# Patient Record
Sex: Male | Born: 1955
Health system: Southern US, Community
[De-identification: ages and names within clinical notes are randomized; demographics above are authoritative.]

## PROBLEM LIST (undated history)

## (undated) DIAGNOSIS — I2510 Atherosclerotic heart disease of native coronary artery without angina pectoris: Secondary | ICD-10-CM

## (undated) DIAGNOSIS — I10 Essential (primary) hypertension: Secondary | ICD-10-CM

## (undated) DIAGNOSIS — M170 Bilateral primary osteoarthritis of knee: Secondary | ICD-10-CM

## (undated) DIAGNOSIS — R739 Hyperglycemia, unspecified: Secondary | ICD-10-CM

## (undated) DIAGNOSIS — E785 Hyperlipidemia, unspecified: Secondary | ICD-10-CM

## (undated) DIAGNOSIS — R7303 Prediabetes: Secondary | ICD-10-CM

## (undated) DIAGNOSIS — Z789 Other specified health status: Secondary | ICD-10-CM

## (undated) DIAGNOSIS — N429 Disorder of prostate, unspecified: Secondary | ICD-10-CM

## (undated) DIAGNOSIS — K921 Melena: Secondary | ICD-10-CM

## (undated) HISTORY — DX: Essential (primary) hypertension: I10

## (undated) HISTORY — DX: Hyperlipidemia, unspecified: E78.5

## (undated) HISTORY — DX: Bilateral primary osteoarthritis of knee: M17.0

## (undated) HISTORY — DX: Atherosclerotic heart disease of native coronary artery without angina pectoris: I25.10

## (undated) HISTORY — DX: Other specified health status: Z78.9

## (undated) HISTORY — DX: Melena: K92.1

## (undated) HISTORY — DX: Prediabetes: R73.03

## (undated) HISTORY — DX: Hyperglycemia, unspecified: R73.9

## (undated) HISTORY — PX: EYE MUSCLE SURGERY: SHX370

## (undated) HISTORY — DX: Disorder of prostate, unspecified: N42.9

---

## 2000-12-28 HISTORY — PX: CORONARY STENT PLACEMENT: SHX1402

## 2001-10-06 ENCOUNTER — Inpatient Hospital Stay (HOSPITAL_COMMUNITY): Admission: AD | Admit: 2001-10-06 | Discharge: 2001-10-08 | Payer: Self-pay | Admitting: Internal Medicine

## 2001-10-07 ENCOUNTER — Encounter: Payer: Self-pay | Admitting: Internal Medicine

## 2005-10-29 ENCOUNTER — Ambulatory Visit: Payer: Self-pay | Admitting: Cardiology

## 2005-11-06 ENCOUNTER — Ambulatory Visit: Payer: Self-pay | Admitting: Cardiology

## 2007-01-03 ENCOUNTER — Ambulatory Visit: Payer: Self-pay | Admitting: Cardiovascular Disease

## 2007-01-03 LAB — CONVERTED CEMR LAB
ALT: 48 units/L — ABNORMAL HIGH (ref 0–40)
AST: 29 units/L (ref 0–37)
Albumin: 4.1 g/dL (ref 3.5–5.2)
Alkaline Phosphatase: 65 units/L (ref 39–117)
BUN: 18 mg/dL (ref 6–23)
CO2: 28 meq/L (ref 19–32)
Calcium: 9 mg/dL (ref 8.4–10.5)
Chloride: 108 meq/L (ref 96–112)
Chol/HDL Ratio, serum: 3.5
Cholesterol: 123 mg/dL (ref 0–200)
Creatinine, Ser: 1 mg/dL (ref 0.4–1.5)
GFR calc non Af Amer: 84 mL/min
Glomerular Filtration Rate, Af Am: 101 mL/min/{1.73_m2}
Glucose, Bld: 104 mg/dL — ABNORMAL HIGH (ref 70–99)
HDL: 34.9 mg/dL — ABNORMAL LOW (ref >39.0)
LDL Cholesterol: 72 mg/dL (ref 0–99)
Potassium: 4 meq/L (ref 3.5–5.1)
Sodium: 143 meq/L (ref 135–145)
Total Bilirubin: 0.8 mg/dL (ref 0.3–1.2)
Total Protein: 6.4 g/dL (ref 6.0–8.3)
Triglyceride fasting, serum: 79 mg/dL (ref 0–149)
VLDL: 16 mg/dL (ref 0–40)

## 2007-01-06 ENCOUNTER — Ambulatory Visit: Payer: Self-pay | Admitting: Cardiovascular Disease

## 2007-01-24 ENCOUNTER — Ambulatory Visit: Payer: Self-pay | Admitting: Cardiovascular Disease

## 2007-01-24 ENCOUNTER — Ambulatory Visit: Payer: Self-pay

## 2007-01-24 LAB — CONVERTED CEMR LAB
BUN: 18 mg/dL (ref 6–23)
CO2: 29 meq/L (ref 19–32)
Calcium: 9.5 mg/dL (ref 8.4–10.5)
Chloride: 105 meq/L (ref 96–112)
Creatinine, Ser: 1.1 mg/dL (ref 0.4–1.5)
GFR calc Af Amer: 90 mL/min
GFR calc non Af Amer: 75 mL/min
Glucose, Bld: 98 mg/dL (ref 70–99)
Potassium: 4.2 meq/L (ref 3.5–5.1)
Sodium: 139 meq/L (ref 135–145)

## 2007-02-16 ENCOUNTER — Ambulatory Visit: Payer: Self-pay | Admitting: Cardiovascular Disease

## 2007-03-08 ENCOUNTER — Ambulatory Visit: Payer: Self-pay | Admitting: Internal Medicine

## 2007-04-15 ENCOUNTER — Ambulatory Visit: Payer: Self-pay | Admitting: Internal Medicine

## 2007-10-07 DIAGNOSIS — I10 Essential (primary) hypertension: Secondary | ICD-10-CM

## 2007-10-07 HISTORY — DX: Essential (primary) hypertension: I10

## 2007-10-08 DIAGNOSIS — E785 Hyperlipidemia, unspecified: Secondary | ICD-10-CM

## 2007-10-08 DIAGNOSIS — I2510 Atherosclerotic heart disease of native coronary artery without angina pectoris: Secondary | ICD-10-CM | POA: Insufficient documentation

## 2007-10-08 HISTORY — DX: Hyperlipidemia, unspecified: E78.5

## 2007-10-08 HISTORY — DX: Atherosclerotic heart disease of native coronary artery without angina pectoris: I25.10

## 2007-10-10 ENCOUNTER — Ambulatory Visit: Payer: Self-pay | Admitting: Internal Medicine

## 2007-11-14 ENCOUNTER — Ambulatory Visit: Payer: Self-pay | Admitting: Internal Medicine

## 2008-01-10 ENCOUNTER — Ambulatory Visit: Payer: Self-pay | Admitting: Internal Medicine

## 2008-01-10 LAB — CONVERTED CEMR LAB
ALT: 38 units/L (ref 0–53)
AST: 21 units/L (ref 0–37)
Albumin: 4 g/dL (ref 3.5–5.2)
Alkaline Phosphatase: 56 units/L (ref 39–117)
BUN: 20 mg/dL (ref 6–23)
Bilirubin, Direct: 0.2 mg/dL (ref 0.0–0.3)
CO2: 31 meq/L (ref 19–32)
Calcium: 9.5 mg/dL (ref 8.4–10.5)
Chloride: 107 meq/L (ref 96–112)
Cholesterol: 126 mg/dL (ref 0–200)
Creatinine, Ser: 0.9 mg/dL (ref 0.4–1.5)
GFR calc Af Amer: 114 mL/min
GFR calc non Af Amer: 94 mL/min
Glucose, Bld: 108 mg/dL — ABNORMAL HIGH (ref 70–99)
HDL: 32.7 mg/dL — ABNORMAL LOW (ref >39.0)
LDL Cholesterol: 80 mg/dL (ref 0–99)
PSA: 0.25 ng/mL (ref 0.10–4.00)
Potassium: 4.2 meq/L (ref 3.5–5.1)
Sodium: 144 meq/L (ref 135–145)
Total Bilirubin: 1.1 mg/dL (ref 0.3–1.2)
Total CHOL/HDL Ratio: 3.9
Total Protein: 6.8 g/dL (ref 6.0–8.3)
Triglycerides: 68 mg/dL (ref 0–149)
VLDL: 14 mg/dL (ref 0–40)

## 2008-01-16 ENCOUNTER — Ambulatory Visit: Payer: Self-pay | Admitting: Internal Medicine

## 2008-07-16 ENCOUNTER — Ambulatory Visit: Payer: Self-pay | Admitting: Internal Medicine

## 2008-07-16 DIAGNOSIS — M159 Polyosteoarthritis, unspecified: Secondary | ICD-10-CM | POA: Insufficient documentation

## 2008-07-16 DIAGNOSIS — M170 Bilateral primary osteoarthritis of knee: Secondary | ICD-10-CM

## 2008-07-16 HISTORY — DX: Bilateral primary osteoarthritis of knee: M17.0

## 2008-10-03 ENCOUNTER — Telehealth: Payer: Self-pay | Admitting: Internal Medicine

## 2008-12-24 ENCOUNTER — Telehealth: Payer: Self-pay | Admitting: Internal Medicine

## 2009-01-08 ENCOUNTER — Ambulatory Visit: Payer: Self-pay | Admitting: Internal Medicine

## 2009-01-08 LAB — CONVERTED CEMR LAB
ALT: 45 units/L (ref 0–53)
AST: 24 units/L (ref 0–37)
BUN: 20 mg/dL (ref 6–23)
CO2: 28 meq/L (ref 19–32)
Calcium: 9.3 mg/dL (ref 8.4–10.5)
Chloride: 111 meq/L (ref 96–112)
Cholesterol: 170 mg/dL (ref 0–200)
Creatinine, Ser: 1 mg/dL (ref 0.4–1.5)
GFR calc Af Amer: 101 mL/min
GFR calc non Af Amer: 83 mL/min
Glucose, Bld: 106 mg/dL — ABNORMAL HIGH (ref 70–99)
HDL: 28.3 mg/dL — ABNORMAL LOW (ref >39.0)
LDL Cholesterol: 116 mg/dL — ABNORMAL HIGH (ref 0–99)
PSA: 0.3 ng/mL (ref 0.10–4.00)
Potassium: 4.3 meq/L (ref 3.5–5.1)
Sodium: 144 meq/L (ref 135–145)
Total CHOL/HDL Ratio: 6
Triglycerides: 130 mg/dL (ref 0–149)
VLDL: 26 mg/dL (ref 0–40)

## 2009-01-14 ENCOUNTER — Ambulatory Visit: Payer: Self-pay | Admitting: Internal Medicine

## 2009-09-03 ENCOUNTER — Ambulatory Visit: Payer: Self-pay | Admitting: Internal Medicine

## 2009-09-03 LAB — CONVERTED CEMR LAB
ALT: 40 units/L (ref 0–53)
AST: 21 units/L (ref 0–37)
Cholesterol: 103 mg/dL (ref 0–200)
HDL: 31.2 mg/dL — ABNORMAL LOW (ref >39.00)
LDL Cholesterol: 57 mg/dL (ref 0–99)
Total CHOL/HDL Ratio: 3
Triglycerides: 72 mg/dL (ref 0.0–149.0)
VLDL: 14.4 mg/dL (ref 0.0–40.0)

## 2009-09-06 ENCOUNTER — Ambulatory Visit: Payer: Self-pay | Admitting: Internal Medicine

## 2009-12-28 HISTORY — PX: COLONOSCOPY: SHX174

## 2010-01-13 ENCOUNTER — Telehealth: Payer: Self-pay | Admitting: Internal Medicine

## 2010-03-11 ENCOUNTER — Telehealth: Payer: Self-pay | Admitting: Internal Medicine

## 2010-03-12 ENCOUNTER — Ambulatory Visit: Payer: Self-pay | Admitting: Internal Medicine

## 2010-03-12 LAB — CONVERTED CEMR LAB
BUN: 21 mg/dL (ref 6–23)
CO2: 29 meq/L (ref 19–32)
Calcium: 8.9 mg/dL (ref 8.4–10.5)
Chloride: 107 meq/L (ref 96–112)
Creatinine, Ser: 0.9 mg/dL (ref 0.4–1.5)
GFR calc non Af Amer: 93.07 mL/min (ref >60)
Glucose, Bld: 129 mg/dL — ABNORMAL HIGH (ref 70–99)
Potassium: 3.4 meq/L — ABNORMAL LOW (ref 3.5–5.1)
Sodium: 141 meq/L (ref 135–145)

## 2010-03-20 ENCOUNTER — Encounter: Payer: Self-pay | Admitting: Internal Medicine

## 2010-03-20 DIAGNOSIS — R739 Hyperglycemia, unspecified: Secondary | ICD-10-CM | POA: Insufficient documentation

## 2010-03-20 HISTORY — DX: Hyperglycemia, unspecified: R73.9

## 2010-04-14 ENCOUNTER — Telehealth: Payer: Self-pay | Admitting: Internal Medicine

## 2010-04-15 ENCOUNTER — Ambulatory Visit: Payer: Self-pay | Admitting: Internal Medicine

## 2010-04-28 ENCOUNTER — Telehealth: Payer: Self-pay | Admitting: Internal Medicine

## 2010-06-13 ENCOUNTER — Encounter: Payer: Self-pay | Admitting: Internal Medicine

## 2010-06-13 LAB — CONVERTED CEMR LAB
BUN: 27 mg/dL — ABNORMAL HIGH (ref 6–23)
CO2: 27 meq/L (ref 19–32)
Calcium: 9.5 mg/dL (ref 8.4–10.5)
Chloride: 104 meq/L (ref 96–112)
Creatinine, Ser: 0.96 mg/dL (ref 0.40–1.50)
Glucose, Bld: 93 mg/dL (ref 70–99)
Hgb A1c MFr Bld: 5.6 % (ref <5.7)
Potassium: 3.9 meq/L (ref 3.5–5.3)
Sodium: 142 meq/L (ref 135–145)

## 2010-06-16 ENCOUNTER — Encounter: Payer: Self-pay | Admitting: Internal Medicine

## 2010-06-17 ENCOUNTER — Telehealth: Payer: Self-pay | Admitting: Internal Medicine

## 2010-06-17 ENCOUNTER — Ambulatory Visit: Payer: Self-pay | Admitting: Internal Medicine

## 2010-06-17 DIAGNOSIS — K921 Melena: Secondary | ICD-10-CM | POA: Insufficient documentation

## 2010-06-17 HISTORY — DX: Melena: K92.1

## 2010-06-17 LAB — CONVERTED CEMR LAB: Fecal Occult Bld: POSITIVE

## 2010-06-18 ENCOUNTER — Encounter (INDEPENDENT_AMBULATORY_CARE_PROVIDER_SITE_OTHER): Payer: Self-pay | Admitting: *Deleted

## 2010-06-23 ENCOUNTER — Telehealth: Payer: Self-pay | Admitting: Internal Medicine

## 2010-07-09 ENCOUNTER — Encounter (INDEPENDENT_AMBULATORY_CARE_PROVIDER_SITE_OTHER): Payer: Self-pay | Admitting: *Deleted

## 2010-07-11 ENCOUNTER — Ambulatory Visit: Payer: Self-pay | Admitting: Internal Medicine

## 2010-07-25 ENCOUNTER — Ambulatory Visit: Payer: Self-pay | Admitting: Internal Medicine

## 2010-07-25 LAB — HM COLONOSCOPY

## 2010-07-29 ENCOUNTER — Telehealth: Payer: Self-pay | Admitting: Internal Medicine

## 2010-07-29 DIAGNOSIS — N429 Disorder of prostate, unspecified: Secondary | ICD-10-CM

## 2010-07-29 HISTORY — DX: Disorder of prostate, unspecified: N42.9

## 2010-09-02 ENCOUNTER — Telehealth: Payer: Self-pay | Admitting: Internal Medicine

## 2010-10-13 ENCOUNTER — Telehealth: Payer: Self-pay | Admitting: Internal Medicine

## 2010-12-15 ENCOUNTER — Ambulatory Visit: Payer: Self-pay | Admitting: Internal Medicine

## 2011-01-13 ENCOUNTER — Telehealth: Payer: Self-pay | Admitting: Internal Medicine

## 2011-01-16 ENCOUNTER — Encounter: Payer: Self-pay | Admitting: Internal Medicine

## 2011-01-17 ENCOUNTER — Encounter: Payer: Self-pay | Admitting: Internal Medicine

## 2011-01-17 LAB — CONVERTED CEMR LAB
ALT: 42 U/L
AST: 24 U/L
Albumin: 4.8 g/dL
Alkaline Phosphatase: 62 U/L
BUN: 24 mg/dL — ABNORMAL HIGH
Bilirubin, Direct: 0.1 mg/dL
CO2: 27 meq/L
Calcium: 9.7 mg/dL
Chloride: 103 meq/L
Cholesterol: 185 mg/dL
Creatinine, Ser: 1.07 mg/dL
Glucose, Bld: 92 mg/dL
HDL: 32 mg/dL — ABNORMAL LOW
Indirect Bilirubin: 0.5 mg/dL
LDL Cholesterol: 90 mg/dL
PSA: 0.36 ng/mL
Potassium: 4 meq/L
Sodium: 140 meq/L
TSH: 1.5 u[IU]/mL
Total Bilirubin: 0.6 mg/dL
Total CHOL/HDL Ratio: 5.8
Total Protein: 6.9 g/dL
Triglycerides: 316 mg/dL — ABNORMAL HIGH
VLDL: 63 mg/dL — ABNORMAL HIGH

## 2011-01-29 NOTE — Letter (Addendum)
   [place] at [facility] [address] [place], [place]  [address]  [place] Phone: [phone]      January 17, 2011   [facility] [address] [place], [place] [address]  RE:  LAB RESULTS  Dear  [name],  The following is an interpretation of your most recent lab tests.  Please take note of any instructions provided or changes to medications that have resulted from your lab work.  PSA:  normal - no follow-up needed PSA: 0.36  ELECTROLYTES:  Good - no changes needed  KIDNEY FUNCTION TESTS:  Good - no changes needed  LIPID PANEL:  Fair - review at your next visit Triglyceride: 316   Cholesterol: 185   LDL: 90   HDL: 32   Chol/HDL%:  5.8 Ratio  THYROID STUDIES:  Thyroid studies normal TSH: 1.500           Sincerely Yours,    Dr. Thomos Lemons  Appended Document:  mailed

## 2011-01-29 NOTE — Assessment & Plan Note (Signed)
Summary: 2 month follow up/mhf   Vital Signs:  Patient profile:   55 year old male Height:      66 inches Weight:      175 pounds O2 Sat:      98 % on Room air Temp:     97.8 degrees F Pulse rate:   58 / minute Pulse rhythm:   regular Resp:     18 per minute BP sitting:   140 / 80  (right arm) Cuff size:   large  Vitals Entered By: Glendell Docker CMA (June 13, 2010 7:58 AM)  O2 Flow:  Room air CC: Rm 2- 2 Month Follow up   Primary Care Provider:  Dondra Spry DO  CC:  Rm 2- 2 Month Follow up.  History of Present Illness:  Hypertension Follow-Up      This is a 55 year old man who presents for Hypertension follow-up.  The patient reports fatigue, but denies lightheadedness.  The patient denies the following associated symptoms: chest pain.  Compliance with medications (by patient report) has been near 100%.  The patient reports that dietary compliance has been fair.    DM II borderline - hard to change diet.    Preventive Screening-Counseling & Management  Alcohol-Tobacco     Smoking Status: never  Allergies (verified): No Known Drug Allergies  Past History:  Past Medical History: Coronary artery disease status post stent x 3 to LAD Normal ejection fraction  Hypertension Negative renal artery Dopplers  Dyslipidemia     Social History: Smoking Status:  never  Review of Systems       some fatigue  Physical Exam  General:  alert, well-developed, and well-nourished.   Neck:  supple, no masses, and no carotid bruits.   Lungs:  normal respiratory effort and normal breath sounds.   Heart:  normal rate, regular rhythm, and no gallop.   Extremities:  trace left pedal edema and trace right pedal edema.   Psych:  normally interactive and good eye contact.     Impression & Recommendations:  Problem # 1:  HYPERTENSION (ICD-401.9) Assessment Improved Maintain current medication regimen.  His updated medication list for this problem includes:  Hydrochlorothiazide 25 Mg Tabs (Hydrochlorothiazide) ..... One by mouth once daily    Altace 10 Mg Caps (Ramipril) .Marland Kitchen... 2 by mouth once daily    Carvedilol 12.5 Mg Tabs (Carvedilol) ..... One tablet twice daily    Amlodipine Besylate 2.5 Mg Tabs (Amlodipine besylate) ..... One by mouth once daily  Orders: T-Basic Metabolic Panel [phone])  BP today: 140/80 Prior BP: 140/90 (03/20/2010)  Labs Reviewed: K+: 3.4 (03/12/2010) Creat: : 0.9 (03/12/2010)   Chol: 103 (09/03/2009)   HDL: 31.20 (09/03/2009)   LDL: 57 (09/03/2009)   TG: 72.0 (09/03/2009)  Problem # 2:  DIABETES MELLITUS, TYPE II, BORDERLINE (ICD-790.29) some improvement in dietary compliance.  monitor A1c  Orders: T-Basic Metabolic Panel [phone]) T- Hemoglobin A1C [phone])  Labs Reviewed: Creat: 0.9 (03/12/2010)     Problem # 3:  HEALTH MAINTENANCE EXAM (ICD-V70.0)  Complete Medication List: 1)  Hydrochlorothiazide 25 Mg Tabs (Hydrochlorothiazide) .... One by mouth once daily 2)  Low-dose Aspirin 81 Mg Tabs (Aspirin) .... One by mouth once daily 3)  Fish Oil Oil (Fish oil) .... By mouth once daily 4)  Altace 10 Mg Caps (Ramipril) .... 2 by mouth once daily 5)  Folbic 2.5-25-2 Mg Tabs (Fa-pyridoxine-cyancobalamin) .... One by mouth qd 6)  Vitamin C 500 Mg Tabs (Ascorbic acid) 7)  Carvedilol 12.5 Mg Tabs (Carvedilol) .... One tablet twice daily 8)  Crestor 20 Mg Tabs (Rosuvastatin calcium) .... One by mouth qd 9)  Amlodipine Besylate 2.5 Mg Tabs (Amlodipine besylate) .... One by mouth once daily  Patient Instructions: 1)  Please schedule a follow-up appointment in 6 months. Prescriptions: AMLODIPINE BESYLATE 2.5 MG TABS (AMLODIPINE BESYLATE) one by mouth once daily  #30 x 0   Entered and Authorized by:   D. Thomos Lemons DO   Signed by:   D. Thomos Lemons DO on 06/13/2010   Method used:   [facility]        [facility]  [address] [phone]       [address]       [place],  [place]  [address]       Ph: [phone] or [phone]       Fax: [phone]   RxID:   [id number] AMLODIPINE BESYLATE 2.5 MG TABS (AMLODIPINE BESYLATE) one by mouth once daily  #90 x 3   Entered and Authorized by:   D. Thomos Lemons DO   Signed by:   D. Thomos Lemons DO on 06/13/2010   Method used:   Electronically to        [facility]* (retail)             ,          Ph: [phone]       Fax: [phone]   RxID:   [id number]   Current Allergies (reviewed today): No known allergies

## 2011-01-29 NOTE — Progress Notes (Signed)
Summary: Test Results  Phone Note Outgoing Call   Summary of Call: call pt - stool study positive for blood.  needs referral for colonoscopy Initial call taken by: D. Thomos Lemons DO,  June 17, 2010 6:00 PM  Follow-up for Phone Call        patient advised per Dr Artist Pais instructions Follow-up by: Glendell Docker CMA,  June 18, 2010 8:45 AM  New Problems: HEMOCCULT POSITIVE STOOL (ICD-578.1)   New Problems: HEMOCCULT POSITIVE STOOL (ICD-578.1)

## 2011-01-29 NOTE — Progress Notes (Signed)
Summary: Folbic & Altace Refill  Phone Note Refill Request Call back at Home Phone [phone] Message from:  Patient on October 13, 2010 8:51 AM  Refills Requested: Medication #1:  ALTACE 10 MG  CAPS 2 by mouth once daily   Dosage confirmed as above?Dosage Confirmed   Brand Name Necessary? No   Supply Requested: 3 months  Medication #2:  FOLBIC 2.5-25-2 MG TABS one by mouth qd   Dosage confirmed as above?Dosage Confirmed   Brand Name Necessary? No   Supply Requested: 3 months [facility]   Method Requested: Electronic Next Appointment Scheduled: 12.19.11 Initial call taken by: Lannette Donath,  October 13, 2010 8:52 AM  Follow-up for Phone Call        Rx completed in Dr. Tiajuana Amass Follow-up by: Glendell Docker CMA,  October 13, 2010 9:11 AM    Prescriptions: ALTACE 10 MG  CAPS (RAMIPRIL) 2 by mouth once daily  #180 x 1   Entered by:   Glendell Docker CMA   Authorized by:   D. Thomos Lemons DO   Signed by:   Glendell Docker CMA on 10/13/2010   Method used:   Faxed to ...       [facility] (mail-order)             , [place]         Ph: [phone]       Fax: [phone]   RxID:   [id number] FOLBIC 2.5-25-2 MG TABS (FA-PYRIDOXINE-CYANCOBALAMIN) one by mouth qd  #90 x 1   Entered by:   Glendell Docker CMA   Authorized by:   D. Thomos Lemons DO   Signed by:   Glendell Docker CMA on 10/13/2010   Method used:   Faxed to ...       [facility] (mail-order)             , [place]         Ph: [phone]       Fax: [phone]   RxID:   [id number]

## 2011-01-29 NOTE — Progress Notes (Signed)
Summary: Rx refill, hctz  Phone Note Refill Request Message from:  Patient on Apr 28, 2010 11:37 AM  Refills Requested: Medication #1:  HYDROCHLOROTHIAZIDE 25 MG  TABS one by mouth once daily Pls send Rx to [facility]  Initial call taken by: Lannette Donath,  Apr 28, 2010 11:38 AM  Follow-up for Phone Call        rx sent to [facility] Follow-up by: D. Thomos Lemons DO,  Apr 28, 2010 1:24 PM  Additional Follow-up for Phone Call Additional follow up Details #1::        Pt notified.  Mervin Kung CMA  Apr 28, 2010 4:44 PM     Prescriptions: HYDROCHLOROTHIAZIDE 25 MG  TABS (HYDROCHLOROTHIAZIDE) one by mouth once daily  #90 x 3   Entered and Authorized by:   D. Thomos Lemons DO   Signed by:   D. Thomos Lemons DO on 04/28/2010   Method used:   Electronically to        [facility]* (mail-order)             ,          Ph: [phone]       Fax: [phone]   RxID:   [id number]

## 2011-01-29 NOTE — Assessment & Plan Note (Signed)
Summary: 6 month follow up/mhf   Vital Signs:  Patient profile:   55 year old male Height:      66 inches Weight:      179 pounds BMI:     29.00 Temp:     98.3 degrees F oral Pulse rate:   66 / minute Resp:     18 per minute BP sitting:   136 / 80  (left arm) Cuff size:   large  Vitals Entered By: Glendell Docker CMA (January 16, 2011 9:02 AM) CC: 6 Month Follow up  Is Patient Diabetic? No Pain Assessment Patient in pain? no        Primary Care Provider:  Dondra Spry DO  CC:  6 Month Follow up .  History of Present Illness:  Hyperlipidemia Follow-Up      This is a 54 year old man who presents for Hyperlipidemia follow-up.  The patient reports muscle aches.  The patient denies the following symptoms: chest pain/pressure and exercise intolerance.  Dietary compliance has been fair.  he stopped crestor after exp left elbow pain.  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  he feels it takes longer for muscles to heal if he is on crestor pt has hx of diffuse joint and muscle discomfort on lipitor  pt had colonoscopy in July.   Dr. Leone Payor noted enlarged appearance of right prostate pt advised to f/u earlier and obtain PSA pt got " too busy "  son is going into marines    Preventive Screening-Counseling & Management  Alcohol-Tobacco     Smoking Status: never  Allergies (verified): 1)  Lipitor (Atorvastatin Calcium)  Past History:  Past Medical History: Coronary artery disease status post stent x 3 to LAD Normal ejection fraction  Hypertension  Negative renal artery Dopplers  Dyslipidemia    SH/Risk Factors reviewed for relevance  Family History: Mother deceased at age 73 with history of coronary disease.  She had her first MI in early 51s.  Mother had diabetes and hypertension. Father deceased at age 56 secondary  to lung cancer.    Review of Systems       he denies urinary complaints.  no weak stream  Physical Exam  General:  alert, well-developed, and  well-hydrated.   Head:  normocephalic and atraumatic.   Neck:  supple, no masses, and no carotid bruits.   Lungs:  normal respiratory effort and normal breath sounds.   Heart:  normal rate, regular rhythm, and no gallop.   Rectal:  no hemorrhoids, normal sphincter tone, and no masses.   Prostate:  no gland enlargement, no nodules, and no asymmetry.     Impression & Recommendations:  Problem # 1:  UNSPECIFIED DISORDER OF PROSTATE (ICD-602.9) Assessment New Dr. Leone Payor noted enlarged appearance of right side of prostate on colonoscopy DRE normal.  check PSA  Orders: T-PSA ([id number])  Problem # 2:  DYSLIPIDEMIA (ICD-272.4) Assessment: Deteriorated he stopped crestor due to elbow tendinitis pt advised to try taking CoQ10 OTC Pt urged to restart Crestor and take regularly  His updated medication list for this problem includes:    Crestor 20 Mg Tabs (Rosuvastatin calcium) ..... One by mouth qd  Orders: T-Lipid Profile [phone]) T-Hepatic Function [phone]) T-TSH [phone])  Problem # 3:  HYPERTENSION (ICD-401.9) Assessment: Improved  His updated medication list for this problem includes:    Hydrochlorothiazide 25 Mg Tabs (Hydrochlorothiazide) ..... One by mouth once daily    Altace 10 Mg Caps (Ramipril) .Marland Kitchen... 2 by mouth once daily  Carvedilol 12.5 Mg Tabs (Carvedilol) ..... One tablet twice daily    Amlodipine Besylate 2.5 Mg Tabs (Amlodipine besylate) ..... One by mouth once daily  Orders: T-Basic Metabolic Panel [id number])  BP today: 136/80 Prior BP: 140/80 (06/13/2010)  Labs Reviewed: K+: 3.9 (06/13/2010) Creat: : 0.96 (06/13/2010)   Chol: 103 (09/03/2009)   HDL: 31.20 (09/03/2009)   LDL: 57 (09/03/2009)   TG: 72.0 (09/03/2009)  Complete Medication List: 1)  Hydrochlorothiazide 25 Mg Tabs (Hydrochlorothiazide) .... One by mouth once daily 2)  Low-dose Aspirin 81 Mg Tabs (Aspirin) .... One by mouth once daily 3)  Fish Oil Oil (Fish oil) .... By  mouth once daily 4)  Altace 10 Mg Caps (Ramipril) .... 2 by mouth once daily 5)  Folbic 2.5-25-2 Mg Tabs (Fa-pyridoxine-cyancobalamin) .... One by mouth qd 6)  Vitamin C 500 Mg Tabs (Ascorbic acid) 7)  Carvedilol 12.5 Mg Tabs (Carvedilol) .... One tablet twice daily 8)  Crestor 20 Mg Tabs (Rosuvastatin calcium) .... One by mouth qd 9)  Amlodipine Besylate 2.5 Mg Tabs (Amlodipine besylate) .... One by mouth once daily  Patient Instructions: 1)  Please schedule a follow-up appointment in 6 months. Prescriptions: AMLODIPINE BESYLATE 2.5 MG TABS (AMLODIPINE BESYLATE) one by mouth once daily  #90 x 1   Entered and Authorized by:   D. Thomos Lemons DO   Signed by:   D. Thomos Lemons DO on 01/16/2011   Method used:   [facility]        [facility]  [address] [phone]* (retail)       [address]       [place], [place]  [address]       Ph: [phone] or [phone]       Fax: [phone]   RxID:   [id number] CRESTOR 20 MG TABS (ROSUVASTATIN CALCIUM) one by mouth qd  #90 x 1   Entered and Authorized by:   D. Thomos Lemons DO   Signed by:   D. Thomos Lemons DO on 01/16/2011   Method used:   [facility]        [facility]  [address] [phone][facility] (retail)       [address]       [place], [place]  [address]       Ph: [phone] or [phone]       Fax: [phone]   RxID:   [id number] CARVEDILOL 12.5 MG TABS (CARVEDILOL) One tablet twice daily  #180 x 1   Entered and Authorized by:   D. Thomos Lemons DO   Signed by:   D. Thomos Lemons DO on 01/16/2011   Method used:   [facility]        [facility]  [address] [phone][facility] (retail)       [address]       [place], [place]  [address]       Ph: [phone] or [phone]       Fax: [phone]   RxID:   [id number] ALTACE 10 MG  CAPS (RAMIPRIL) 2 by mouth once daily  #180 x 1   Entered and Authorized by:   D. Thomos Lemons DO   Signed by:   D. Thomos Lemons DO on 01/16/2011   Method used:   [facility]         [facility]  [address] [phone][facility] (  retail)       [address]       [place], [place]  [address]       Ph: [phone] or [phone]       Fax: [phone]   RxID:   [id number] HYDROCHLOROTHIAZIDE 25 MG  TABS (HYDROCHLOROTHIAZIDE) one by mouth once daily  #90 x 1   Entered and Authorized by:   D. Thomos Lemons DO   Signed by:   D. Thomos Lemons DO on 01/16/2011   Method used:   [facility]        [facility]  [address] [phone][facility] (retail)       [address]       [place], [place]  [address]       Ph: [phone] or [phone]       Fax: [phone]   RxID:   [id number]    Orders Added: 1)  T-PSA [phone] 2)  T-Basic Metabolic Panel [id number] 3)  T-Lipid Profile [80061-22930] 4)  T-Hepatic Function [80076-22960] 5)  T-TSH [93810-17510] 6)  Est. Patient Level IV [[address]852]    Current Allergies (reviewed today): LIPITOR (ATORVASTATIN CALCIUM)

## 2011-01-29 NOTE — Progress Notes (Signed)
Summary: refill--Carvedilol  Phone Note Refill Request Call back at Home Phone [phone] Message from:  Patient on June 23, 2010 9:31 AM  Refills Requested: Medication #1:  CARVEDILOL 12.5 MG TABS One tablet twice daily Pls send Rx to [facility] ASAP, only has 3 days worth left   Initial call taken by: Lannette Donath,  June 23, 2010 9:31 AM Caller: Patient    Prescriptions: CARVEDILOL 12.5 MG TABS (CARVEDILOL) One tablet twice daily  #180 x 1   Entered by:   Mervin Kung CMA   Authorized by:   D. Thomos Lemons DO   Signed by:   Mervin Kung CMA on 06/23/2010   Method used:   Faxed to ...       [facility] (mail-order)             , [place]         Ph: [phone]       Fax: [phone]   RxID:   [id number]

## 2011-01-29 NOTE — Progress Notes (Signed)
Summary: Mail Order Refills   Phone Note Refill Request Call back at Home Phone [phone] Message from:  Patient on April 14, 2010 12:30 PM  Refills Requested: Medication #1:  FOLBIC 2.5-25-2 MG TABS one by mouth qd   Dosage confirmed as above?Dosage Confirmed   Brand Name Necessary? No   Supply Requested: 3 months  Medication #2:  ALTACE 10 MG  CAPS 2 by mouth once daily   Dosage confirmed as above?Dosage Confirmed   Brand Name Necessary? No   Supply Requested: 3 months please send new rx for 90 day supply to [facility]    Method Requested: Electronic Next Appointment Scheduled: 05-19-10 [facility]  Initial call taken by: Roselle Locus,  April 14, 2010 12:31 PM  Follow-up for Phone Call        Rx completed in Dr. Tiajuana Amass Follow-up by: Glendell Docker CMA,  April 14, 2010 1:42 PM    Prescriptions: FOLBIC 2.5-25-2 MG TABS (FA-PYRIDOXINE-CYANCOBALAMIN) one by mouth qd  #90 x 1   Entered by:   Glendell Docker CMA   Authorized by:   D. Thomos Lemons DO   Signed by:   Glendell Docker CMA on 04/14/2010   Method used:   Electronically to        [facility]* (mail-order)             ,          Ph: [phone]       Fax: [phone]   RxID:   [id number] ALTACE 10 MG  CAPS (RAMIPRIL) 2 by mouth once daily  #180 x 1   Entered by:   Glendell Docker CMA   Authorized by:   D. Thomos Lemons DO   Signed by:   Glendell Docker CMA on 04/14/2010   Method used:   Electronically to        [facility]* (mail-order)             ,          Ph: [phone]       Fax: [phone]   RxID:   [id number]

## 2011-01-29 NOTE — Progress Notes (Signed)
Summary: Crestor Refill  Phone Note Refill Request Message from:  Patient on September 02, 2010 11:45 AM  Refills Requested: Medication #1:  CRESTOR 20 MG TABS one by mouth qd   Dosage confirmed as above?Dosage Confirmed   Brand Name Necessary? No   Supply Requested: 3 months [facility]   Method Requested: Electronic Next Appointment Scheduled: 12/15/2010  @ 8am Dr Artist Pais  Initial call taken by: Darral Dash,  September 02, 2010 11:46 AM  Follow-up for Phone Call        Rx completed in Dr. Tiajuana Amass Follow-up by: Glendell Docker CMA,  September 02, 2010 12:34 PM    Prescriptions: CRESTOR 20 MG TABS (ROSUVASTATIN CALCIUM) one by mouth qd  #90 x 1   Entered by:   Glendell Docker CMA   Authorized by:   D. Thomos Lemons DO   Signed by:   Glendell Docker CMA on 09/02/2010   Method used:   Faxed to ...       [facility] (mail-order)             , [place]         Ph: [phone]       Fax: [phone]   RxID:   [id number]

## 2011-01-29 NOTE — Letter (Signed)
Summary: [facility]: Immunoassay Fecal Occult Blood (iFOB) Order Psychologist, counselling at [facility]  [address]   [place], [place] [address]   Phone: [phone]  Fax: [phone]       Lab: Immunoassay Fecal Occult Blood (iFOB) Order Form   June 13, 2010 MRN: [id number]   [name] 1956-10-23   Physician Name: Dr. Thomos Lemons  Diagnosis Code: V70.0      Glendell Docker CMA

## 2011-01-29 NOTE — Letter (Signed)
   [place] at [facility] [address] [place], [place]  [address]  [place] Phone: [phone]      June 16, 2010   [facility] [address] [place], [place] [address]  RE:  LAB RESULTS  Dear  [name],  The following is an interpretation of your most recent lab tests.  Please take note of any instructions provided or changes to medications that have resulted from your lab work.  ELECTROLYTES:  Good - no changes needed  KIDNEY FUNCTION TESTS:  Good - no changes needed   DIABETIC STUDIES:  Excellent - no changes needed Blood Glucose: 93   HgbA1C: 5.6      Please continue to avoid sweets and limit your carbohydrate intake.        Sincerely Yours,    Dr. Thomos Lemons

## 2011-01-29 NOTE — Miscellaneous (Signed)
Summary: [facility]  Clinical Lists Changes  Medications: Added new medication of MOVIPREP 100 GM  SOLR (PEG-KCL-NACL-NASULF-NA ASC-C) As per prep instructions. - Signed Rx of MOVIPREP 100 GM  SOLR (PEG-KCL-NACL-NASULF-NA ASC-C) As per prep instructions.;  #1 x 0;  Signed;  Entered by: Ezra Sites RN;  Authorized by: Iva Boop MD, [facility];  Method used: [facility]  [address] [phone]*, [address] [place], [place], [place]  [address], Ph: [phone] or [phone], Fax: [phone] Observations: Added new observation of NKA: T (07/11/2010 7:49)    Prescriptions: MOVIPREP 100 GM  SOLR (PEG-KCL-NACL-NASULF-NA ASC-C) As per prep instructions.  #1 x 0   Entered by:   Ezra Sites RN   Authorized by:   Iva Boop MD, [facility]   Signed by:   Ezra Sites RN on 07/11/2010   Method used:   [facility]        [facility]  [address] [phone]* (retail)       [address]       [place], [place]  [address]       Ph: [phone] or [phone]       Fax: [phone]   RxID:   [id number]

## 2011-01-29 NOTE — Assessment & Plan Note (Signed)
Summary: f/u - jr   Vital Signs:  Patient profile:   55 year old male Height:      66 inches Weight:      179 pounds BMI:     29.00 O2 Sat:      98 % on Room air Temp:     98.3 degrees F oral Pulse rate:   52 / minute Pulse rhythm:   regular Resp:     18 per minute BP sitting:   140 / 90  (right arm) Cuff size:   regular  Vitals Entered By: Glendell Docker CMA (March 20, 2010 8:12 AM)  O2 Flow:  Room air  Contraindications/Deferment of Procedures/Staging:    Test/Procedure: FLU VAX    Reason for deferment: patient declined  CC: Rm 2- Follow up disease management Comments discuss mail order medication   Primary Care Provider:  DThomos Lemons DO  CC:  Rm 2- Follow up disease management.  History of Present Illness:  Hypertension Follow-Up      This is a 55 year old man who presents for Hypertension follow-up.  The patient denies lightheadedness.  The patient denies the following associated symptoms: chest pressure.  Compliance with medications (by patient report) has been sporadic.  The patient reports that dietary compliance has been fair.  The patient reports exercising occasionally.    Fam hx of diabetes - tries to watch his sweets.    Preventive Screening-Counseling & Management  Alcohol-Tobacco     Smoking Status: quit  Allergies (verified): No Known Drug Allergies  Past History:  Past Medical History: Coronary artery disease status post stent x 3 to LAD Normal ejection fraction  Hypertension Negative renal artery Dopplers Dyslipidemia     Family History: Mother deceased at age 82 with history of coronary disease.  She had her first MI in early 68s.  Mother had diabetes and hypertension. Father deceased at age 18 secondary  to lung cancer.    Social History: Occupation: Curator for [employer] Married - 20 years to his second wife. He has 3 children ages 27, 82, and 16 Never Smoked Alcohol use-no    Smoking Status:  quit  Physical  Exam  General:  alert, well-developed, and well-nourished.   Lungs:  normal respiratory effort and normal breath sounds.   Heart:  normal rate, regular rhythm, no murmur, and no gallop.   Extremities:  No lower extremity edema    Impression & Recommendations:  Problem # 1:  HYPERTENSION (ICD-401.9) Assessment Deteriorated suboptimal.  add amlodipine  His updated medication list for this problem includes:    Hydrochlorothiazide 25 Mg Tabs (Hydrochlorothiazide) ..... One by mouth once daily    Altace 10 Mg Caps (Ramipril) .Marland Kitchen... 2 by mouth once daily    Carvedilol 12.5 Mg Tabs (Carvedilol) ..... One tablet twice daily    Amlodipine Besylate 2.5 Mg Tabs (Amlodipine besylate) ..... One by mouth once daily  BP today: 140/90 Prior BP: 138/80 (09/06/2009)  Labs Reviewed: K+: 3.4 (03/12/2010) Creat: : 0.9 (03/12/2010)   Chol: 103 (09/03/2009)   HDL: 31.20 (09/03/2009)   LDL: 57 (09/03/2009)   TG: 72.0 (09/03/2009)  Problem # 2:  DIABETES MELLITUS, TYPE II, BORDERLINE (ICD-790.29) Mother and her side of family has diabetes.  recent CBG (non fasting) 129.  He exercises on regular basis.  Carb counting handout provided.  limit carbs to 100 grams per day.  Labs Reviewed: Creat: 0.9 (03/12/2010)     Complete Medication List: 1)  Hydrochlorothiazide 25 Mg  Tabs (Hydrochlorothiazide) .... One by mouth once daily 2)  Low-dose Aspirin 81 Mg Tabs (Aspirin) .... One by mouth once daily 3)  Fish Oil Oil (Fish oil) .... By mouth once daily 4)  Altace 10 Mg Caps (Ramipril) .... 2 by mouth once daily 5)  Folbic 2.5-25-2 Mg Tabs (Fa-pyridoxine-cyancobalamin) .... One by mouth qd 6)  Vitamin C 500 Mg Tabs (Ascorbic acid) 7)  Carvedilol 12.5 Mg Tabs (Carvedilol) .... One tablet twice daily 8)  Crestor 20 Mg Tabs (Rosuvastatin calcium) .... One by mouth qd 9)  Amlodipine Besylate 2.5 Mg Tabs (Amlodipine besylate) .... One by mouth once daily  Patient Instructions: 1)  Please schedule a follow-up  appointment in 2 months. 2)  HbgA1C prior to visit, ICD-9: 790.29 3)  Please return for lab work one (1) week before your next appointment.  Prescriptions: CRESTOR 20 MG TABS (ROSUVASTATIN CALCIUM) one by mouth qd  #90 x 1   Entered and Authorized by:   D. Thomos Lemons DO   Signed by:   D. Thomos Lemons DO on 03/20/2010   Method used:   [facility]  [address] [address] [phone][facility] (retail)       [address]       [place], [place]  [address]       Ph: [phone] or [phone]       Fax: [phone]   RxID:   [id number] CARVEDILOL 12.5 MG TABS (CARVEDILOL) One tablet twice daily  #180 x 1   Entered and Authorized by:   D. Thomos Lemons DO   Signed by:   D. Thomos Lemons DO on 03/20/2010   Method used:   [facility]        [facility]  [address] [phone]* (retail)       [address]       [place], [place]  [address]       Ph: [phone] or [phone]       Fax: [phone]   RxID:   [id number] ALTACE 10 MG  CAPS (RAMIPRIL) 2 by mouth once daily  #180 x 1   Entered and Authorized by:   D. Thomos Lemons DO   Signed by:   D. Thomos Lemons DO on 03/20/2010   Method used:   [facility]        [facility]  [address] [phone]* (retail)       [address]       [place], [place]  [address]       Ph: [phone] or [phone]       Fax: [phone]   RxID:   [id number] HYDROCHLOROTHIAZIDE 25 MG  TABS (HYDROCHLOROTHIAZIDE) one by mouth once daily  #90 x 1   Entered and Authorized by:   D. Thomos Lemons DO   Signed by:   D. Thomos Lemons DO on 03/20/2010   Method used:   [facility]        [facility]  [address] [phone]* (retail)       [address]       [place], [place]  [address]       Ph: [phone] or [phone]       Fax: [phone]   RxID:   [id number] AMLODIPINE BESYLATE 2.5 MG TABS (  AMLODIPINE BESYLATE) one by mouth once daily  #30 x 2   Entered and Authorized by:   D. Thomos Lemons DO   Signed by:   D. Thomos Lemons  DO on 03/20/2010   Method used:   [facility]        [facility]  [address] [phone]* (retail)       [address]       [place], [place]  [address]       Ph: [phone] or [phone]       Fax: [phone]   RxID:   [id number]   Current Allergies (reviewed today): No known allergies     Preventive Care Screening  Last Flu Shot:    Date:  03/20/2010    Results:  Declined

## 2011-01-29 NOTE — Procedures (Signed)
Summary: Colonoscopy  Patient: [name] Note: All result statuses are Final unless otherwise noted.  Tests: (1) Colonoscopy (COL)   COL Colonoscopy           DONE     [facility]     [name]     [place], [place]  [address]           COLONOSCOPY PROCEDURE REPORT           PATIENT:  [name], [name]  MR#:  [id number]     BIRTHDATE:  [birth year], 54 yrs. old  GENDER:  male     ENDOSCOPIST:  Iva Boop, MD, [facility]     REF. BY:  Thomos Lemons, DO     PROCEDURE DATE:  07/25/2010     PROCEDURE:  Colonoscopy 19[address]     ASA CLASS:  Class II     INDICATIONS:  Routine Risk Screening     MEDICATIONS:   Fentanyl 50 mcg IV, Versed 7 mg IV           DESCRIPTION OF PROCEDURE:   After the risks benefits and     alternatives of the procedure were thoroughly explained, informed     consent was obtained.  Digital rectal exam was performed and     revealed an enlarged prostate.  The right lobe was more prominent     than the left and mildly enlarged. The LB [id number]     endoscope was introduced through the anus and advanced to the     cecum, which was identified by both the appendix and ileocecal     valve, without limitations.  The quality of the prep was good,     using MoviPrep.  The instrument was then slowly withdrawn as the     colon was fully examined.     <<PROCEDUREIMAGES>>           FINDINGS:  A normal appearing cecum, ileocecal valve, and     appendiceal orifice were identified. The ascending, hepatic     flexure, transverse, splenic flexure, descending, sigmoid colon,     and rectum appeared unremarkable.   Retroflexed views in the     rectum revealed no abnormalities.    The scope was then withdrawn     from the patient and the procedure completed.           COMPLICATIONS:  None     ENDOSCOPIC IMPRESSION:     1) Normal colonoscopy     2) Enlarged right lobe of prostate without nodules.     RECOMMENDATIONS:     I will notify Dr. Artist Pais re: enlarged prostate and  next step.     Please be sure to call his office if you do not hear from him.     REPEAT EXAM:  In 10 year(s) for routine screening colonoscopy.           ______________________________     Iva Boop, MD, Clement[facility] Graham           CC:  Thomos Lemons, DOThe Patient           n.     Rosalie DoctorMarland Kitchen   Iva Boop at 07/25/2010 09:00 AM           Louis Meckel, [phone]  Note: An exclamation mark (!) indicates a result that was not dispersed into the flowsheet. Document Creation Date: 07/25/2010 9:01 AM _______________________________________________________________________  (1) Order result status: Final Collection or observation date-time: 07/25/2010  08:48 Requested date-time:  Receipt date-time:  Reported date-time:  Referring Physician:   Ordering Physician: Stan Head [id number]) Specimen Source:  Source: Launa Grill Order Number: [id number] Lab site:   Appended Document: Colonoscopy    Clinical Lists Changes  Observations: Added new observation of COLONNXTDUE: 06/2020 (07/25/2010 12:55)

## 2011-01-29 NOTE — Letter (Signed)
Summary: Previsit letter  [facility]  [address] [place], [place] [address]   Phone: [phone]  Fax: [phone]       06/18/2010 MRN: [id number]  [facility] [address] [place], [place]  [address]  Dear Mr. [name],  Welcome to the Gastroenterology Division at [facility].    You are scheduled to see a nurse for your pre-procedure visit on 07-11-10 at 8:00a.m. on the 3rd floor at [facility], [address]  We ask that you try to arrive at our office 15 minutes prior to your appointment time to allow for check-in.  Your nurse visit will consist of discussing your medical and surgical history, your immediate family medical history, and your medications.    Please bring a complete list of all your medications or, if you prefer, bring the medication bottles and we will list them.  We will need to be aware of both prescribed and over the counter drugs.  We will need to know exact dosage information as well.  If you are on blood thinners (Coumadin, Plavix, Aggrenox, Ticlid, etc.) please call our office today/prior to your appointment, as we need to consult with your physician about holding your medication.   Please be prepared to read and sign documents such as consent forms, a financial agreement, and acknowledgement forms.  If necessary, and with your consent, a friend or relative is welcome to sit-in on the nurse visit with you.  Please bring your insurance card so that we may make a copy of it.  If your insurance requires a referral to see a specialist, please bring your referral form from your primary care physician.  No co-pay is required for this nurse visit.     If you cannot keep your appointment, please call [phone] to cancel or reschedule prior to your appointment date.  This allows [place] the opportunity to schedule an appointment for another patient in need of care.    Thank you for choosing [facility] for your medical  needs.  We appreciate the opportunity to care for you.  Please visit [place] at our website  to learn more about our practice.                     Sincerely.                                                                                                                   The Gastroenterology Division

## 2011-01-29 NOTE — Letter (Signed)
Summary: [facility] Instructions  [facility]  [address] [place], [place] [address]   Phone: [phone]  Fax: [phone]       [name]    Jan 02, 1956    MRN: [id number]        Procedure Day /Date:  Friday 07/25/2010     Arrival Time: 7:30 am      Procedure Time: 8:30 am     Location of Procedure:                    _ x_  [facility] (4th Floor)                        PREPARATION FOR COLONOSCOPY WITH MOVIPREP   Starting 5 days prior to your procedure Sunday 7/24 do not eat nuts, seeds, popcorn, corn, beans, peas,  salads, or any raw vegetables.  Do not take any fiber supplements (e.g. Metamucil, Citrucel, and Benefiber).  THE DAY BEFORE YOUR PROCEDURE         DATE: Thursday 7/28  1.  Drink clear liquids the entire day-NO SOLID FOOD  2.  Do not drink anything colored red or purple.  Avoid juices with pulp.  No orange juice.  3.  Drink at least 64 oz. (8 glasses) of fluid/clear liquids during the day to prevent dehydration and help the prep work efficiently.  CLEAR LIQUIDS INCLUDE: Water Jello Ice Popsicles Tea (sugar ok, no milk/cream) Powdered fruit flavored drinks Coffee (sugar ok, no milk/cream) Gatorade Juice: apple, white grape, white cranberry  Lemonade Clear bullion, consomm, broth Carbonated beverages (any kind) Strained chicken noodle soup Hard Candy                             4.  In the morning, mix first dose of MoviPrep solution:    Empty 1 Pouch A and 1 Pouch B into the disposable container    Add lukewarm drinking water to the top line of the container. Mix to dissolve    Refrigerate (mixed solution should be used within 24 hrs)  5.  Begin drinking the prep at 5:00 p.m. The MoviPrep container is divided by 4 marks.   Every 15 minutes drink the solution down to the next mark (approximately 8 oz) until the full liter is complete.   6.  Follow completed prep with 16 oz of clear liquid of your choice (Nothing red  or purple).  Continue to drink clear liquids until bedtime.  7.  Before going to bed, mix second dose of MoviPrep solution:    Empty 1 Pouch A and 1 Pouch B into the disposable container    Add lukewarm drinking water to the top line of the container. Mix to dissolve    Refrigerate  THE DAY OF YOUR PROCEDURE      DATE: Friday 7/29  Beginning at 3:30 a.m. (5 hours before procedure):         1. Every 15 minutes, drink the solution down to the next mark (approx 8 oz) until the full liter is complete.  2. Follow completed prep with 16 oz. of clear liquid of your choice.    3. You may drink clear liquids until 6:30 am (2 HOURS BEFORE PROCEDURE).   MEDICATION INSTRUCTIONS  Unless otherwise instructed, you should take regular prescription medications with a small sip of water   as early as possible the morning of  your procedure.    Additional medication instructions: Do not take HCTZ day of procedure.         OTHER INSTRUCTIONS  You will need a responsible adult at least 56 years of age to accompany you and drive you home.   This person must remain in the waiting room during your procedure.  Wear loose fitting clothing that is easily removed.  Leave jewelry and other valuables at home.  However, you may wish to bring a book to read or  an iPod/MP3 player to listen to music as you wait for your procedure to start.  Remove all body piercing jewelry and leave at home.  Total time from sign-in until discharge is approximately 2-3 hours.  You should go home directly after your procedure and rest.  You can resume normal activities the  day after your procedure.  The day of your procedure you should not:   Drive   Make legal decisions   Operate machinery   Drink alcohol   Return to work  You will receive specific instructions about eating, activities and medications before you leave.    The above instructions have been reviewed and explained to me by   Ezra Sites  RN  July 11, 2010 8:11 AM    I fully understand and can verbalize these instructions _____________________________ Date _________

## 2011-01-29 NOTE — Progress Notes (Signed)
Summary: Carvedilol Refill  Phone Note Refill Request Message from:  Patient on March 11, 2010 11:06 AM  Refills Requested: Medication #1:  CARVEDILOL 12.5 MG TABS One tablet twice daily   Dosage confirmed as above?Dosage Confirmed   Brand Name Necessary? No Call in refill to [facility] and call patient back and let him know status on this refill.    Next Appointment Scheduled: 03/20/10 Dr.YOO Initial call taken by: Michaelle Copas,  March 11, 2010 11:13 AM  Follow-up for Phone Call        Rx completed in Dr. Tiajuana Amass Follow-up by: Glendell Docker CMA,  March 11, 2010 11:49 AM    Prescriptions: CARVEDILOL 12.5 MG TABS (CARVEDILOL) One tablet twice daily  #180 x 0   Entered by:   Glendell Docker CMA   Authorized by:   D. Thomos Lemons DO   Signed by:   Glendell Docker CMA on 03/11/2010   Method used:   Electronically to        [facility]* (mail-order)             ,          Ph: [phone]       Fax: [phone]   RxID:   [id number]

## 2011-01-29 NOTE — Progress Notes (Signed)
Summary: Refill   Carvedilol   Phone Note Call from Patient   Caller: Patient Details for Reason: Refill Summary of Call: Pt's insurance has changed  cannot [place] [facility]   , [facility]   [place] Initial call taken by: Darral Dash,  January 13, 2011 11:07 AM  Follow-up for Phone Call        ok for refill to local pharm Follow-up by: D. Thomos Lemons DO,  January 13, 2011 12:01 PM  Additional Follow-up for Phone Call Additional follow up Details #1::        Phone Call Completed, rx sent electronically to pharmacy Additional Follow-up by: Glendell Docker CMA,  January 13, 2011 1:08 PM    Prescriptions: CARVEDILOL 12.5 MG TABS (CARVEDILOL) One tablet twice daily  #30 x 2   Entered by:   Glendell Docker CMA   Authorized by:   D. Thomos Lemons DO   Signed by:   Glendell Docker CMA on 01/13/2011   Method used:   [facility]  [address] [phone][facility] ([facility]ail[facility]       [address]       [place], [place]  [address]       Ph: [phone] or [phone]       Fax: [phone]   RxID:   [id number]

## 2011-01-29 NOTE — Progress Notes (Signed)
Summary: Return Office Visit  Phone Note Outgoing Call   Summary of Call: call pt - he needs earlier appt.  Dr. Leone Payor noted enlarged right lobe of prostate.  I suggest PSA before OV.   Initial call taken by: D. Thomos Lemons DO,  July 29, 2010 12:10 PM  Follow-up for Phone Call        call placed to patient at [phone], no answer. A detailed voice message was left informing patient per Dr Artist Pais instructions. Message was left for patient to call to schedule follow up appointment with Dr Artist Pais and blood work Follow-up by: Glendell Docker CMA,  July 29, 2010 3:01 PM  Additional Follow-up for Phone Call Additional follow up Details #1::        call placed to patient at [phone]. Patient states he recieved voice message that was previously left, he states he is unable to take off of work until next week. He states that he will call back to schedule follow up as instructed. Additional Follow-up by: Glendell Docker CMA,  August 01, 2010 2:07 PM  New Problems: UNSPECIFIED DISORDER OF PROSTATE (ICD-602.9)   New Problems: UNSPECIFIED DISORDER OF PROSTATE (ICD-602.9)

## 2011-01-29 NOTE — Progress Notes (Signed)
Summary: [facility] Refill  Phone Note Refill Request Message from:  Patient on January 13, 2010 8:42 AM  Refills Requested: Medication #1:  FOLBIC 2.5-25-2 MG TABS one by mouth qd   Dosage confirmed as above?Dosage Confirmed   Supply Requested: 3 months  Medication #2:  ALTACE 10 MG  CAPS 2 by mouth once daily   Dosage confirmed as above?Dosage Confirmed   Supply Requested: 3 months Pt. needs his meds to be ordered through [facility]. He also is needing a refill on his fluid pill. Call pt. with any problems. Thank you  Initial call taken by: Michaelle Copas,  January 13, 2010 8:43 AM Caller: Patient  Follow-up for Phone Call        ok to refill x3 Follow-up by: D. Thomos Lemons DO,  January 14, 2010 3:36 PM  Additional Follow-up for Phone Call Additional follow up Details #1::        Rx sent electronically to pharmacy Additional Follow-up by: Glendell Docker CMA,  January 14, 2010 4:45 PM    Prescriptions: HYDROCHLOROTHIAZIDE 25 MG  TABS (HYDROCHLOROTHIAZIDE) one by mouth once daily  #90 x 0   Entered by:   Glendell Docker CMA   Authorized by:   D. Thomos Lemons DO   Signed by:   Glendell Docker CMA on 01/14/2010   Method used:   Electronically to        [facility]* (mail-order)             ,          Ph: [phone]       Fax: [phone]   RxID:   [id number] FOLBIC 2.5-25-2 MG TABS (FA-PYRIDOXINE-CYANCOBALAMIN) one by mouth qd  #90 x 0   Entered by:   Glendell Docker CMA   Authorized by:   D. Thomos Lemons DO   Signed by:   Glendell Docker CMA on 01/14/2010   Method used:   Electronically to        [facility]* (mail-order)             ,          Ph: [phone]       Fax: [phone]   RxID:   [id number] ALTACE 10 MG  CAPS (RAMIPRIL) 2 by mouth once daily  #180 x 0   Entered by:   Glendell Docker CMA   Authorized by:   D. Thomos Lemons DO   Signed by:   Glendell Docker CMA on 01/14/2010   Method used:   Electronically to        [facility]* (mail-order)          ,          Ph: [phone]       Fax: [phone]   RxID:   [id number]

## 2011-04-13 ENCOUNTER — Telehealth: Payer: Self-pay | Admitting: Internal Medicine

## 2011-04-13 DIAGNOSIS — I10 Essential (primary) hypertension: Secondary | ICD-10-CM

## 2011-04-13 NOTE — Telephone Encounter (Signed)
Refill- folbic tablet. Take 1 tablet by mouth every day. Qty 90. Last fill 1.16.12  Refill- ramipril 10mg  capsule. Take 2 capsules by mouth every day. Qty 180. Last fill 1.16.12

## 2011-04-14 MED ORDER — FA-PYRIDOXINE-CYANOCOBALAMIN 2.5-25-2 MG PO TABS: 1.0000 | ORAL_TABLET | Freq: Every day | ORAL | Status: DC

## 2011-04-14 MED ORDER — RAMIPRIL 10 MG PO CAPS: 10.0000 mg | ORAL_CAPSULE | Freq: Two times a day (BID) | ORAL | Status: DC

## 2011-04-14 NOTE — Telephone Encounter (Signed)
rx refill sent to pharmacy 

## 2011-05-15 NOTE — Assessment & Plan Note (Signed)
[facility]                           PRIMARY CARE OFFICE NOTE   ZAEL, SHUMAN                        MRN:          [id number]  DATE:03/08/2007                            DOB:          03-04-1956    CHIEF COMPLAINT:  New patient to practice.   HISTORY OF PRESENT ILLNESS:  The patient is a 55 year old white male  with history of coronary disease here to establish primary care.  He is  followed by Dr. Excell Seltzer at [facility].  He has a history of  coronary artery disease, underwent stenting x3 of his LAD in October  2002.  He is doing very well without symptoms of chest pain or dyspnea.  He has had recent issues with elevated blood pressure readings and  medications adjusted with higher dose of Altace.  He does track his  blood pressure readings at home and he routinely gets systolic blood  pressures in the 140s.   He is maintained on Lipitor and review of his chart notes LDL at goal,  less than 100.  He denies any adverse effect from his medications.  The  patient also reports family history of type 2 diabetes and review of his  laboratory results notes occasional mildly elevated blood sugar readings  in the past.   CURRENT MEDICATIONS:  1. Foltx one p.o. daily.  2. Hydrochlorothiazide 25 mg once a day.  3. Lipitor 5 mg once a day.  4. Aspirin 81 mg once a day.  5. Toprol-XL 50 mg once a day.  6. Altace 10 mg two tablets a day.  7. Over-the-counter vitamin C once a day.   ALLERGY TO MEDICATIONS:  None known.   PAST MEDICAL HISTORY SUMMARY:  1. Coronary artery disease status post stent x3 to LAD.  2. Normal ejection fraction.  3. Essential hypertension.  4. Negative renal artery Dopplers.  5. Dyslipidemia.   SOCIAL HISTORY:  The patient is married for the last 20 years to his  second wife.  He has three children ages 70, 68, and 7.  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   FAMILY HISTORY:  Mother  deceased at age 5.  She had a history of  coronary artery disease, had her first MI in her early 27s.  She was  also know to be diabetic and hypertensive.  Father deceased at age 16  secondary to lung cancer.  He was a smoker.   Younger brother has obesity, older brother known heart disease.  No  family history of colon cancer.   HABITS:  He does not drink or smoke.  No history of recreational drug  use.   REVIEW OF SYSTEMS:  As noted above.  No GI symptoms.  No GU symptoms.  Denies any sexual dysfunction.  All other systems negative.   PHYSICAL EXAMINATION:  VITAL SIGNS:  Height is 65 inches, weight is 169  pounds, temperature is 98.7, pulse is 61 and BP is 147/85 in the left  arm in a seated position.  GENERAL:  The patient is a very pleasant,  well-developed, well-nourished  55 year old white male in no apparent distress.  HEENT:  Normocephalic, atraumatic.  Pupils were equal and reacted to  light bilaterally.  Extraocular motility was intact.  The patient was  anicteric.  Conjunctivae were within normal limits.  External auditory  canals and tympanic membranes were clear bilaterally.  Hearing was  grossly normal.  Oropharyngeal exam was unremarkable.  NECK:  Supple without adenopathy, carotid bruit or thyromegaly.  CHEST:  Normal inspiratory effort.  Chest was clear to auscultation  bilaterally.  No rhonchi, rales or wheezing.  CARDIOVASCULAR:  Regular rate and rhythm.  No significant murmurs, rubs  or gallops appreciated.  ABDOMEN:  Soft, nontender, positive bowel sounds, no organomegaly.  MUSCULOSKELETAL:  No clubbing, cyanosis, or edema.  EXTREMITIES:  The patient had intact pedis dorsalis pulses bilaterally.  SKIN:  Warm and dry.  NEUROLOGIC:  Cranial nerves II-XII were grossly intact and nonfocal.   IMPRESSION/RECOMMENDATIONS:  1. Hypertension, suboptimally controlled.  2. Coronary artery disease status post stent to left anterior      descending artery x3.  3. Normal  ejection fraction.  4. Dyslipidemia.  5. Health maintenance.   RECOMMENDATIONS:  We will discontinue Toprol-XL and change the patient  to Coreg CR 20 mg once a day.  He was given approximately 6 weeks of  samples.  He will return to our office in 6 weeks.  He is to note any  adverse effects and monitor his blood pressure at home.  His blood  pressure goal, considering coronary artery disease, is less than 130/80.   On followup visit we will discuss health maintenance issues such as  routine colonoscopy at age 72.     Barbette Hair. Artist Pais, DO  Electronically Signed   RDY/MedQ  DD: 03/08/2007  DT: 03/10/2007  Job #: [id number]

## 2011-05-15 NOTE — Discharge Summary (Signed)
[place]  Patient:    [name] Visit Number: [id number] MRN: [id number]          Service Type: MED Location: [phone] Attending Physician:  [name] Dictated by:   [name], P.A.-C. Admit Date:  10/06/2001 Discharge Date: 10/08/2001   CC:         [name], M.D.  [name], [name] [name]  [name], M.D. [name]   Referring Physician Discharge [place]  DATE OF BIRTH:  05-20-2056  SUMMARY OF HISTORY:  [name] is a 55 year old white male who has complained of a two-month history of exertional chest discomfort, especially worse with a.m. exertion.  He describes it as a burning sensation radiating into both arms, associated with shortness of breath, relieved by rest in less than five minutes.  He has not had any rest episodes.  He evidently had a stress Cardiolite in the office today and the images were reported as abnormal. Thus, he was referred for cardiac catheterization.  His history is notable for hypertension and hyperlipidemia.  LABORATORY DATA:  At [name]. [name], the H&H was 15.5 and 44.1, normal indices, platelets 167, and WBC 6.8.  Sodium 139, potassium 4.2, BUN 18, creatinine 1.1.  ALT was slightly elevated at 41.  Homocystine was 11.56.  Fasting lipids showed a cholesterol of 166, triglycerides 345, HDL 28, and LDL 69.  The chest x-ray did not show any active disease.  The EKG showed normal sinus rhythm.  From the [name], the PSA was 0.5.  Lipids showed a total cholesterol of 192, triglycerides 204, LDL 113, and HDL 38.  HOSPITAL COURSE:  [name] was admitted to 36.  Overnight he did not have any further chest discomfort.  Cardiac catheterization was performed on October 07, 2001, by [name], M.D.  This showed a proximal 25% LAD shelf-like lesion, a 95% LAD prior to septal 1, mid 99% LAD, and mid 75-80% LAD.  The circumflex had some luminal  irregularities.  The RCA was dominant with luminal irregularities with RCA to LAD collaterals.  The ramus was a large superior branch with a proximal 80% lesion.  The OM had a 75% lesion.  The ejection fraction was 65% and very mild apical hypokinesis. Angioplasty and stenting was performed to the LAD by [name], M.D.  During sheath removal, he had a vasovagal reaction which lasted approximately 30 seconds and resolved with fluids.  Post bed rest, he was ambulating without difficulty.  The catheterization site did show some signs of ecchymosis and soft hematoma without bruit.  Pulses were intact.  On October 08, 2001, after review by [name], M.D., it was felt that he could be discharged home.  DISCHARGE DIAGNOSES: 1. Unstable angina. 2. Status post angioplasty and stenting to the left anterior descending x 3. 3. Residual nonobstructive coronary disease as previously described. 4. Hyperlipidemia. 5. Hypertension.  DISPOSITION:  He was discharged home.  DISCHARGE MEDICATIONS: 1. Plavix 75 mg q.d. for four weeks. 2. Coated aspirin 325 mg q.d. 3. Altace increased to 2.5 mg b.i.d. 4. Zocor 20 mg q.h.s.  ACTIVITY:  He was advised no lifting, driving, sexual activity, or heavy exertion x 2 days.  No lifting for two weeks.  DIET:  Maintain a low-salt, low-fat, and low-cholesterol diet.  WOUND Terry:  If he had any problems with his catheterization site, he was instructed to call.  FOLLOW-UP:  In approximately six weeks, he  will need fasting lipids and LFTs since he was just started on Zocor.  He will see [name], M.D., on Friday, October 28, 2001, at 4:30 p.m.  At that time, discussion needs to be entertained in regards to cardiac rehabilitation.  Consider changing Altace to 5 mg once a day. Dictated by:   [name], P.A.-C. Attending Physician:  [name] DD:  10/08/01 TD:  10/08/01 Job: [id number]

## 2011-05-15 NOTE — Cardiovascular Report (Signed)
[place]  Patient:    [name], [name] Visit Number: [id number] MRN: [id number]          Service Type: MED Location: [phone] Attending Physician:  Pricilla Riffle Dictated by:   Rollene Rotunda, M.D. [facility] Proc. Date: 10/07/01 Admit Date:  10/06/2001   CC:         Eula Listen, M.D.   Cardiac Catheterization  DATE OF BIRTH:  1956-12-07.  REASON FOR PRESENTATION:  Evaluate patient with an abnormal Cardiolite suggesting anterior ischemia.  PROCEDURAL NOTE:  Left heart catheterization was performed via the right femoral artery.  The artery was cannulated using anterior wall puncture.  A 6-French arterial sheath was inserted via the modified Seldinger technique. Preformed Judkins and a pigtail catheter were utilized.  The patient tolerated the procedure well.  RESULTS:  Hemodynamics:  LV 156/96, AO 155/12.  Coronaries: 1. The left main was normal. 2. The LAD had proximal 25% stenosis followed by a shelf-like 95% before the    first septal perforator.  There was mid 99% stenosis followed by a 75-80%    lesion.  There was TIMI-2 flow in the LAD following the mid 99% lesion.    There were right-to-left collaterals. 3. Circumflex:  The circumflex was a large vessel.  There were luminal    irregularities in the AV groove.  The ramus intermedius was a very large    vessel.  The superior branch had a proximal 80% stenosis.  OM-1 had a    proximal 75% stenosis. 4. Right coronary artery:  The right coronary artery was dominant with    luminal irregularities.  Again, there were right-to-left collaterals.  Left ventriculogram:  A left ventriculogram was obtained in the RAO projection.  The EF was 65% with very mild apical hypokinesis.  CONCLUSIONS:  Severe left anterior descending artery stenosis with high-grade circumflex (ramus intermedius) lesions.  PLAN:  The patient will have percutaneous revascularization of the LAD.   Will consider medical management versus revascularization electively particularly of the ramus intermedius branch. Dictated by:   Rollene Rotunda, M.D. [facility] Attending Physician:  Pricilla Riffle DD:  10/07/01 TD:  10/07/01 Job: [id number]

## 2011-05-15 NOTE — Assessment & Plan Note (Signed)
[facility]                            CARDIOLOGY OFFICE NOTE   SIMEON, VERA                        MRN:          [id number]  DATE:02/16/2007                            DOB:          Mar 10, 1956    HISTORY OF PRESENT ILLNESS:  Schon Zeiders is a 55 year old gentleman with  coronary artery disease and hypertension.  He has undergone previous  stenting of his LAD back in October of 2002 and continues to do well  from a symptomatic standpoint.  He was last seen in the office on  January 10 and was found to have markedly elevated blood pressures, with  diastolic blood pressures greater than 100 bilaterally.  His Altace was  increased and a renal ultrasound was performed that demonstrated normal  bilateral renal arteries.   Mr. Terrien continues to do well from a symptomatic standpoint.  He  reports better blood pressure control at home with blood pressures  generally in the 130s/80s.  He has no complaints and maintains an active  lifestyle.  He specifically denies chest pain, dyspnea, lightheadedness,  syncope, orthopnea, PND or edema.   MEDICATIONS:  1. Foltx 1 daily.  2. Hydrochlorothiazide 25 mg daily.  3. Lipitor 5 mg daily.  4. Aspirin 81 mg daily.  5. Fish oil daily.  6. Toprol XL 50 mg daily.  7. Altace 20 mg daily.   PHYSICAL EXAMINATION:  He is alert and oriented, in no acute distress.  Weight is 172 pounds, blood pressure is 140/89.  Heart rate was 60.  Respiratory rate is 12.  HEENT:  Normal.  NECK:  Normal carotid upstrokes without bruits.  Jugular venous pressure  is normal.  LUNGS:  Clear to auscultation bilaterally.  HEART:  Regular rate and rhythm without murmurs or gallops.  ABDOMEN:  Soft, nontender, no organomegaly.  EXTREMITIES:  No clubbing, cyanosis or edema.  Peripheral pulses are 2+  and equal throughout.   IMPRESSION AND PLAN:  Mr. Bunyard is currently stable from a cardiac  standpoint.  His problems are as follows:  1. Coronary artery disease.  He should continue with his exercise      program and risk factor modification as detailed in the note from      his last visit.  2. Essential hypertension.  Negative evaluation for renal artery      stenosis.  He has better blood pressure control with his increased      dose of angiotensin-converting enzyme inhibitor.  Recommended      continuation of his current medical therapy.  3. Dyslipidemia.  Continue low-dose Lipitor therapy.  His total      cholesterol on January 03, 2007, was 123, triglycerides were 79, LDL      was 72 and HDL was 35.  He should continue yearly lipid panels and      liver function tests.   FOLLOWUP:  I would like to see Mr. Dobie back in 1 year or sooner if any  problem arise.  He is working on establishing care with a primary care  physician.     Casimiro Needle  Karren Burly, MD  Electronically Signed    MDC/MedQ  DD: 02/17/2007  DT: 02/18/2007  Job #: [id number]

## 2011-05-15 NOTE — Progress Notes (Signed)
[facility]                        PERIPHERAL VASCULAR OFFICE NOTE   Vincent, Terry                        MRN:          [id number]  DATE:01/06/2007                            DOB:          [birth year]    [name] was seen as a outpatient at the [facility] on  January 06, 2007.  He is a very pleasant 55 year old man with coronary  artery disease and hypertension.  He underwent stenting of his LAD back  in October 2002 and has done very well from a symptomatic standpoint  since that time.  His last Cardiolite stress test was in August 2005 and  was normal.  He was able to exercise for 12 minutes on the Bruce  protocol for that study.   He reports today that he has been doing well.  He has no chest pain,  dyspnea, orthopnea, PND, lightheadedness, palpitations, syncope, edema  or claudication symptoms.  He brings in a record of his blood pressures  this week that show average systolic blood pressures in the 140s with  diastolic blood pressures in the 90s.  In reviewing his records, it  appears that Dr. Andee Lineman advised him to increase his Altace to 20 mg  daily at the time of last year's visit but the patient continues on 10  mg of Altace per day.   CURRENT MEDICATIONS:  1. Foltx one daily.  2. Hydrochlorothiazide 25 mg daily.  3. Lipitor 5 mg daily.  4. Aspirin 81 mg daily.  5. Fish oil daily.  6. Toprol-XL 50 mg daily.  7. Altace 10 mg daily.   ALLERGIES:  No known drug allergies.   PHYSICAL EXAMINATION:  VITAL SIGNS:  The patient is alert and oriented.  He is in no acute distress.  His weight is 174 pounds.  Initial blood  pressure 179/99.  On my check his left arm blood pressure is 190/102 and  his right arm blood pressure is 175/104.  Heart rate 58.  Respiratory  rate 12.  HEENT:  Normal.  NECK:  Normal carotid upstrokes without bruits.  Jugular venous pressure  is normal.  No thyromegaly.  LUNGS:  Clear to  auscultation bilaterally.  CARDIOVASCULAR:  The apex is discrete and nondisplaced.  The heart is  regular rate and rhythm without murmurs or gallops.  ABDOMEN:  Soft,  nontender.  No abdominal bruits.  No organomegaly.  EXTREMITIES:  No clubbing, cyanosis or edema.  Peripheral pulses are 2+  and equal throughout.  NEUROLOGIC: Cranial nerves II-XII intact.  Strength is 5/5 and equal in  the arms and legs.   EKG shows normal sinus bradycardia and is otherwise within normal  limits.  Ventricular rate is 58 beats per minute.   ASSESSMENT:  1. Coronary artery disease.  The patient remains asymptomatic. His      last nuclear perfusion study was in 2005.  In the absence of      symptoms, will not do any stress testing at this point.  He      maintains an excellent exercise program as he walks two to three  miles daily and rides on a stationary bike and has no symptoms.  He      was encouraged to continue with his exercise program and current      medical therapy.  2. Hypertension, poorly controlled.  I advised Mr. Pyle to increase      his Altace to 20 mg daily.  He was written a prescription for this      today.  I think in the setting of three-drug therapy and persistent      marked hypertension with known atherosclerotic disease, it is      reasonable to evaluate with renal ultrasound.  If he does not have      significant renal artery stenosis, then we will continue to titrate      his medications.  He will likely need another drug.  I will plan on      seeing him back in one month for followup and proceed with medicine      titration from there.  3. Dyslipidemia.  Laboratory studies from January 7 show minimally      elevated ALT of 48.  His AST is normal at 29.  Creatinine is normal      at 1.  Total cholesterol is 123 with HDL of 35 and LDL cholesterol      is 72.  Recommend continuation of current therapy with low dose      Lipitor.  4. Follow up as described above.  We will  see him back in one month.      We will review the results of his renal ultrasound when available.      The patient needs a primary care physician for health maintenance      issues and we made a referral today.     Veverly Fells. Excell Seltzer, MD  Electronically Signed    MDC/MedQ  DD: 01/06/2007  DT: 01/06/2007  Job #: [id number]

## 2011-05-15 NOTE — Cardiovascular Report (Signed)
[place]  Patient:    [name], [name] Visit Number: [id number] MRN: [id number]          Service Type: MED Location: [phone] Attending Physician:  Pricilla Riffle Dictated by:   Veneda Melter, M.D. Proc. Date: 10/07/01 Admit Date:  10/06/2001   CC:         Lewayne Bunting, M.D. [facility]  Eula Listen, M.D.   Cardiac Catheterization  PROCEDURE PERFORMED: Percutaneous transluminal coronary angioplasty and stenting of the mid and distal left anterior descending.  DIAGNOSIS: Severe single-vessel coronary artery disease.  INDICATIONS: The patient is a 55 year old, white male, without prior cardiac history, who presents with progressive substernal chest discomfort.  The patient underwent stress imaging study which showed ischemia in the anterior wall and he underwent cardiac catheterization by Dr. Antoine Poche earlier today showing severe single-vessel coronary artery disease with lesions in the mid and distal LAD. He has well preserved LV function.  He presents now for percutaneous intervention.  TECHNIQUE: Informed consent was obtained. An existing 6 French sheath was exchanged for a 7 [place] sheath. The patient was given heparin and Integrilin on a weight-adjusted basis to maintain ACT of greater than 250 seconds. In addition he was given Plavix 300 mg orally. A 7 French Q 3.5 guide catheter was used to engage the left coronary artery and selective guide shots using manual injections of contrast. This showed a high-grade plaque of 70% in the mid LAD and immediately after the first diagonal branch there was a further subtotal narrowing of 99% in the mid section following by a high-grade tubular narrowing of 70% in the distal section.  A 0.14 inch extra-support wire was then advanced to the distal LAD and a 3.0 x 10 mm Cutting Balloon introduced. This was used to dilate the distal lesion at 6 atmospheres at 30 seconds x2 inflations, the mid lesion at 6  atmospheres times 30 seconds for 2 inflations as well as the proximal mid lesion at 10 atmospheres for 30 seconds x2 lesions.  Repeat angiography showed only mild improvement in the vessel lumen in the distal lesion. There was mild improvement in lumen but residual haziness in the mid lesion and only modest improvement of lumen in the proximal lesion. A 3.0 x 12 mm Express II stent was then introduced to the mid lesion deployed at 12 atmospheres for 60 seconds. A 3.5 x 16 mm Express II was placed in the proximal lesion and deployed at 14 atmospheres for 60 seconds. Repeat angiography showed significant improvement in these vessel lumen with no residual stenosis.  However, there was now evidence of a dissection in the distal lesion which was previously unappreciated. A 3.0 x 12 mm Express II was then positioned in the distal LAD and deployed at 12 atmospheres for 30 seconds. Repeat angiography was now performed after the administration of intracoronary nitroglycerin showing excellent result with no residual stenosis in the area of stenting. There was moderate residual disease of 30% in the mid LAD and improvement from TIMI grade 2 to TIMI grade 3 flow. Final angiography was performed in various projections, confirmed these findings.  The guide catheter was then removed and the sheath secured in position. The patient tolerated the procedure well.  FINAL RESULTS: 1. Successful percutaneous transluminal coronary angioplasty and stenting of    the mid left anterior descending immediately after the first diagonal    branch with reduction of 70% eccentric narrowing to 0% with placement of  a 3.5 x 16 mm Express II. 2. Successful percutaneous transluminal coronary angioplasty and stenting of    of the mid left anterior descending with reduction of subtotal 99%    narrowing to 0% with placement of a 3.0 x 12 mm Express II. 3. Successful percutaneous transluminal coronary angioplasty and stenting  of    the distal left anterior descending with reduction of 70% narrowing to 0%    with placement of a 3.0 x 12 mm Express II stent. Dictated by:   Veneda Melter, M.D. Attending Physician:  Pricilla Riffle DD:  10/07/01 TD:  10/08/01 Job: [id number]

## 2011-07-10 ENCOUNTER — Telehealth: Payer: Self-pay | Admitting: Internal Medicine

## 2011-07-10 DIAGNOSIS — I10 Essential (primary) hypertension: Secondary | ICD-10-CM

## 2011-07-10 MED ORDER — RAMIPRIL 10 MG PO CAPS: 10.0000 mg | ORAL_CAPSULE | Freq: Two times a day (BID) | ORAL | Status: DC

## 2011-07-10 NOTE — Telephone Encounter (Signed)
Pt is scheduled for follow up.  RX sent.  Pt should keep follow up for more refills.

## 2011-07-10 NOTE — Telephone Encounter (Signed)
Refill- ramipril 10mg  capsule. Qty 180. Take one capsule by mouth twice a day. Last fill 4.17.12

## 2011-07-20 ENCOUNTER — Ambulatory Visit: Payer: Self-pay | Admitting: Internal Medicine

## 2011-07-21 ENCOUNTER — Encounter: Payer: Self-pay | Admitting: Internal Medicine

## 2011-07-24 ENCOUNTER — Ambulatory Visit (INDEPENDENT_AMBULATORY_CARE_PROVIDER_SITE_OTHER): Payer: BC Managed Care – PPO | Admitting: Internal Medicine

## 2011-07-24 ENCOUNTER — Encounter: Payer: Self-pay | Admitting: Internal Medicine

## 2011-07-24 DIAGNOSIS — E119 Type 2 diabetes mellitus without complications: Secondary | ICD-10-CM

## 2011-07-24 DIAGNOSIS — E785 Hyperlipidemia, unspecified: Secondary | ICD-10-CM

## 2011-07-24 DIAGNOSIS — I10 Essential (primary) hypertension: Secondary | ICD-10-CM

## 2011-07-24 DIAGNOSIS — R7309 Other abnormal glucose: Secondary | ICD-10-CM

## 2011-07-24 LAB — BASIC METABOLIC PANEL
CO2: 25 mEq/L (ref 19–32)
Calcium: 9.9 mg/dL (ref 8.4–10.5)
Creat: 0.9 mg/dL (ref 0.50–1.35)

## 2011-07-24 LAB — HEPATIC FUNCTION PANEL
AST: 25 U/L (ref 0–37)
Albumin: 4.7 g/dL (ref 3.5–5.2)
Alkaline Phosphatase: 50 U/L (ref 39–117)
Total Protein: 7.1 g/dL (ref 6.0–8.3)

## 2011-07-24 LAB — LIPID PANEL
HDL: 34 mg/dL — ABNORMAL LOW (ref >39)
Triglycerides: 135 mg/dL (ref <150)

## 2011-07-24 LAB — HEMOGLOBIN A1C: Mean Plasma Glucose: 114 mg/dL (ref <117)

## 2011-07-24 NOTE — Assessment & Plan Note (Signed)
Obtain chem7, a1c 

## 2011-07-24 NOTE — Progress Notes (Signed)
  Subjective:    Patient ID: [name], male    DOB: [birth year], 55 y.o.   MRN: [id number]  HPI Pt presents to clinic for followup of HTN and hyperlipidemia. Compliant with crestor however has associated arthralgias without myalgias. With h/o CAD s/p stent placement has scheduleded cardiology followup. Last ldl jan 2012 90. Walks a mile a day. No cp or dyspnea. H/o mild dm not requiring medication. BP reviewed under avg control. No other complaints.  Reviewed pmh, medications and allergies.    Review of Systems see hpi     Objective:   Physical Exam  Nursing note and vitals reviewed. Constitutional: He appears well-developed and well-nourished. No distress.  HENT:  Head: Normocephalic and atraumatic.  Right Ear: External ear normal.  Left Ear: External ear normal.  Mouth/Throat: Oropharynx is clear and moist. No oropharyngeal exudate.  Eyes: Conjunctivae are normal. No scleral icterus.  Neck: Neck supple. No JVD present.  Cardiovascular: Normal rate, regular rhythm and normal heart sounds.  Exam reveals no gallop and no friction rub.   No murmur heard. Pulmonary/Chest: Breath sounds normal. No respiratory distress. He has no wheezes. He has no rales.  Lymphadenopathy:    He has no cervical adenopathy.  Neurological: He is alert.  Skin: Skin is warm and dry. He is not diaphoretic.  Psychiatric: He has a normal mood and affect.          Assessment & Plan:   No problem-specific assessment & plan notes found for this encounter.

## 2011-07-24 NOTE — Assessment & Plan Note (Signed)
Normotensive and stable. Continue current regimen. Monitor bp as outpt and followup in clinic as scheduled.  

## 2011-07-24 NOTE — Patient Instructions (Signed)
Please schedule cbc 401.9 chem7 v58.69, lipid/lft 272.4 prior to next visit

## 2011-07-24 NOTE — Assessment & Plan Note (Signed)
Obtain lipid/lft. Pt plans to discuss statin with cardiology. If cardiology has no statin preference then consider alternative

## 2011-08-19 ENCOUNTER — Other Ambulatory Visit: Payer: Self-pay | Admitting: Internal Medicine

## 2011-08-24 ENCOUNTER — Other Ambulatory Visit: Payer: Self-pay | Admitting: Internal Medicine

## 2011-10-08 ENCOUNTER — Other Ambulatory Visit: Payer: Self-pay | Admitting: Internal Medicine

## 2011-10-17 ENCOUNTER — Other Ambulatory Visit: Payer: Self-pay | Admitting: Internal Medicine

## 2011-10-20 NOTE — Telephone Encounter (Signed)
Rx refill sent to pharmacy. 

## 2012-01-05 ENCOUNTER — Telehealth: Payer: Self-pay | Admitting: Internal Medicine

## 2012-01-05 MED ORDER — CARVEDILOL 12.5 MG PO TABS: 12.5000 mg | ORAL_TABLET | Freq: Two times a day (BID) | ORAL | Status: DC

## 2012-01-05 NOTE — Telephone Encounter (Signed)
Rx refill sent to pharmacy. 

## 2012-01-11 ENCOUNTER — Other Ambulatory Visit: Payer: BC Managed Care – PPO

## 2012-01-18 ENCOUNTER — Telehealth: Payer: Self-pay | Admitting: Internal Medicine

## 2012-01-18 ENCOUNTER — Ambulatory Visit (INDEPENDENT_AMBULATORY_CARE_PROVIDER_SITE_OTHER): Payer: 59 | Admitting: Internal Medicine

## 2012-01-18 ENCOUNTER — Encounter: Payer: Self-pay | Admitting: Internal Medicine

## 2012-01-18 VITALS — BP 120/68 | HR 61 | Temp 97.8°F | Resp 18 | Ht 66.0 in | Wt 177.0 lb

## 2012-01-18 DIAGNOSIS — I10 Essential (primary) hypertension: Secondary | ICD-10-CM

## 2012-01-18 DIAGNOSIS — E785 Hyperlipidemia, unspecified: Secondary | ICD-10-CM

## 2012-01-18 DIAGNOSIS — Z23 Encounter for immunization: Secondary | ICD-10-CM

## 2012-01-18 DIAGNOSIS — R7309 Other abnormal glucose: Secondary | ICD-10-CM

## 2012-01-18 DIAGNOSIS — Z125 Encounter for screening for malignant neoplasm of prostate: Secondary | ICD-10-CM

## 2012-01-18 DIAGNOSIS — R739 Hyperglycemia, unspecified: Secondary | ICD-10-CM

## 2012-01-18 LAB — CBC WITH DIFFERENTIAL/PLATELET
Basophils Absolute: 0 10*3/uL (ref 0.0–0.1)
Basophils Relative: 0 % (ref 0–1)
Eosinophils Absolute: 0.3 10*3/uL (ref 0.0–0.7)
Eosinophils Relative: 3 % (ref 0–5)
HCT: 44.2 % (ref 39.0–52.0)
Lymphocytes Relative: 24 % (ref 12–46)
MCHC: 34.2 g/dL (ref 30.0–36.0)
MCV: 86.2 fL (ref 78.0–100.0)
Monocytes Absolute: 0.6 10*3/uL (ref 0.1–1.0)
Platelets: 162 10*3/uL (ref 150–400)
RDW: 13.7 % (ref 11.5–15.5)
WBC: 7.7 10*3/uL (ref 4.0–10.5)

## 2012-01-18 LAB — HEPATIC FUNCTION PANEL
AST: 21 U/L (ref 0–37)
Albumin: 4.5 g/dL (ref 3.5–5.2)
Alkaline Phosphatase: 54 U/L (ref 39–117)
Bilirubin, Direct: 0.1 mg/dL (ref 0.0–0.3)
Indirect Bilirubin: 0.6 mg/dL (ref 0.0–0.9)
Total Bilirubin: 0.7 mg/dL (ref 0.3–1.2)

## 2012-01-18 LAB — BASIC METABOLIC PANEL
BUN: 23 mg/dL (ref 6–23)
Calcium: 9.3 mg/dL (ref 8.4–10.5)
Chloride: 104 mEq/L (ref 96–112)
Creat: 0.9 mg/dL (ref 0.50–1.35)

## 2012-01-18 LAB — LIPID PANEL
HDL: 32 mg/dL — ABNORMAL LOW (ref >39)
LDL Cholesterol: 101 mg/dL — ABNORMAL HIGH (ref 0–99)

## 2012-01-18 NOTE — Patient Instructions (Signed)
Please schedule fasting labs before your next appointment Chem7, a1c-hyperglycemia, lipid/lft-272.4

## 2012-01-18 NOTE — Progress Notes (Signed)
  Subjective:    Patient ID: [name], male    DOB: [birth year], 56 y.o.   MRN: [id number]  HPI Pt presents to clinic for followup of multiple medical problems. Notes intermittent arthralgias possibly related to crestor use. No other alleviating or exacerbating factors. Taking crestor intermittently as a result. Denies chest pain or dyspnea. No other complaints.   Past Medical History  Diagnosis Date  . CAD (coronary artery disease)     post stent X 3 to LAD  . Hypertension   . Dyslipidemia    No past surgical history on file.  reports that he has never smoked. He has never used smokeless tobacco. He reports that he does not drink alcohol. His drug history not on file. family history includes Cancer in his father; Coronary artery disease in his mother; Diabetes in his mother; Heart attack in his mother; and Hypertension in his mother. Allergies  Allergen Reactions  . Atorvastatin     REACTION: myalgias     Review of Systems see hpi     Objective:   Physical Exam  Physical Exam  Nursing note and vitals reviewed. Constitutional: Appears well-developed and well-nourished. No distress.  HENT:  Head: Normocephalic and atraumatic.  Right Ear: External ear normal.  Left Ear: External ear normal.  Eyes: Conjunctivae are normal. No scleral icterus.  Neck: Neck supple. Carotid bruit is not present.  Cardiovascular: Normal rate, regular rhythm and normal heart sounds.  Exam reveals no gallop and no friction rub.   No murmur heard. Pulmonary/Chest: Effort normal and breath sounds normal. No respiratory distress. He has no wheezes. no rales.  Lymphadenopathy:    He has no cervical adenopathy.  Neurological:Alert.  Skin: Skin is warm and dry. Not diaphoretic.  Psychiatric: Has a normal mood and affect.        Assessment & Plan:

## 2012-01-18 NOTE — Telephone Encounter (Signed)
Lab orders entered for July 2013. 

## 2012-01-18 NOTE — Assessment & Plan Note (Signed)
Normotensive and stable. Continue current regimen. Monitor bp as outpt and followup in clinic as scheduled.  

## 2012-01-18 NOTE — Assessment & Plan Note (Signed)
Obtain lipid/lft. ?alternative statin if suboptimal

## 2012-01-18 NOTE — Assessment & Plan Note (Signed)
Obtain chem7 and a1c 

## 2012-01-19 LAB — PSA: PSA: 0.34 ng/mL (ref <=4.00)

## 2012-01-19 LAB — HEMOGLOBIN A1C
Hgb A1c MFr Bld: 5.5 % (ref <5.7)
Mean Plasma Glucose: 111 mg/dL (ref <117)

## 2012-02-16 ENCOUNTER — Telehealth: Payer: Self-pay | Admitting: Internal Medicine

## 2012-02-16 MED ORDER — HYDROCHLOROTHIAZIDE 25 MG PO TABS: 25.0000 mg | ORAL_TABLET | Freq: Every day | ORAL | Status: DC

## 2012-02-16 NOTE — Telephone Encounter (Signed)
Rx refill sent to pharmacy. 

## 2012-02-16 NOTE — Telephone Encounter (Signed)
Refill hydrochlorothiazide 25 mg tab qty 90

## 2012-02-17 ENCOUNTER — Telehealth: Payer: Self-pay | Admitting: Internal Medicine

## 2012-02-17 MED ORDER — AMLODIPINE BESYLATE 2.5 MG PO TABS: 2.5000 mg | ORAL_TABLET | Freq: Every day | ORAL | Status: DC

## 2012-02-17 NOTE — Telephone Encounter (Signed)
Refill- amlodipine besylate 2.5mg  tab. Take one tablet by mouth every day. Qty 90 last fill 11.24.12

## 2012-02-17 NOTE — Telephone Encounter (Signed)
Rx refill sent to pharmacy. 

## 2012-04-06 ENCOUNTER — Other Ambulatory Visit: Payer: Self-pay | Admitting: Internal Medicine

## 2012-04-19 ENCOUNTER — Telehealth: Payer: Self-pay | Admitting: Internal Medicine

## 2012-04-19 DIAGNOSIS — I10 Essential (primary) hypertension: Secondary | ICD-10-CM

## 2012-04-19 MED ORDER — FA-PYRIDOXINE-CYANOCOBALAMIN 2.5-25-2 MG PO TABS: 1.0000 | ORAL_TABLET | Freq: Every day | ORAL | Status: DC

## 2012-04-19 NOTE — Telephone Encounter (Signed)
Refill- folbic tablet. Take one tablet by mouth every day. Qty 90 last fill 3.18.13

## 2012-04-19 NOTE — Telephone Encounter (Signed)
Rx refill sent to pharmacy. 

## 2012-07-22 ENCOUNTER — Ambulatory Visit (INDEPENDENT_AMBULATORY_CARE_PROVIDER_SITE_OTHER): Payer: 59 | Admitting: Internal Medicine

## 2012-07-22 ENCOUNTER — Encounter: Payer: Self-pay | Admitting: Internal Medicine

## 2012-07-22 VITALS — BP 134/94 | HR 49 | Temp 98.0°F | Resp 16 | Ht 65.0 in | Wt 173.0 lb

## 2012-07-22 DIAGNOSIS — I2510 Atherosclerotic heart disease of native coronary artery without angina pectoris: Secondary | ICD-10-CM

## 2012-07-22 DIAGNOSIS — R7309 Other abnormal glucose: Secondary | ICD-10-CM

## 2012-07-22 DIAGNOSIS — E785 Hyperlipidemia, unspecified: Secondary | ICD-10-CM

## 2012-07-22 DIAGNOSIS — R7303 Prediabetes: Secondary | ICD-10-CM

## 2012-07-22 LAB — LIPID PANEL
HDL: 31 mg/dL — ABNORMAL LOW (ref >39)
Total CHOL/HDL Ratio: 5.9 Ratio
Triglycerides: 157 mg/dL — ABNORMAL HIGH (ref <150)
VLDL: 31 mg/dL (ref 0–40)

## 2012-07-22 LAB — HEPATIC FUNCTION PANEL
Albumin: 4.6 g/dL (ref 3.5–5.2)
Indirect Bilirubin: 0.6 mg/dL (ref 0.0–0.9)
Total Protein: 6.6 g/dL (ref 6.0–8.3)

## 2012-07-22 LAB — BASIC METABOLIC PANEL
CO2: 27 mEq/L (ref 19–32)
Calcium: 9.4 mg/dL (ref 8.4–10.5)
Creat: 0.88 mg/dL (ref 0.50–1.35)

## 2012-07-22 LAB — HEMOGLOBIN A1C: Mean Plasma Glucose: 114 mg/dL (ref <117)

## 2012-07-22 NOTE — Assessment & Plan Note (Signed)
Obtain chem7 and a1c 

## 2012-07-22 NOTE — Assessment & Plan Note (Signed)
Encouraged periodic f/u with cardiology

## 2012-07-22 NOTE — Assessment & Plan Note (Signed)
Obtain lipid/lft. Taking statin intermittently

## 2012-07-22 NOTE — Progress Notes (Signed)
  Subjective:    Patient ID: [name], male    DOB: 03/05/1956, 56 y.o.   MRN: [id number]  HPI Pt presents to clinic for followup of multiple medical problems. BP rechecked to be 132/82. Has been normal on home monitoring recently. Still taking crestor intermittently due to arthralgias. Hasn't followed up with cardiology for a checkup but plans to make an appointment. Has been walking more at work.  Past Medical History  Diagnosis Date  . CAD (coronary artery disease)     post stent X 3 to LAD  . Hypertension   . Dyslipidemia    No past surgical history on file.  reports that he has never smoked. He has never used smokeless tobacco. He reports that he does not drink alcohol. His drug history not on file. family history includes Cancer in his father; Coronary artery disease in his mother; Diabetes in his mother; Heart attack in his mother; and Hypertension in his mother. Allergies  Allergen Reactions  . Atorvastatin     REACTION: myalgias      Review of Systems see hpi     Objective:   Physical Exam  Physical Exam  Nursing note and vitals reviewed. Constitutional: Appears well-developed and well-nourished. No distress.  HENT:  Head: Normocephalic and atraumatic.  Right Ear: External ear normal.  Left Ear: External ear normal.  Eyes: Conjunctivae are normal. No scleral icterus.  Neck: Neck supple. Carotid bruit is not present.  Cardiovascular: Normal rate, regular rhythm and normal heart sounds.  Exam reveals no gallop and no friction rub.   No murmur heard. Pulmonary/Chest: Effort normal and breath sounds normal. No respiratory distress. He has no wheezes. no rales.  Lymphadenopathy:    He has no cervical adenopathy.  Neurological:Alert.  Skin: Skin is warm and dry. Not diaphoretic.  Psychiatric: Has a normal mood and affect.        Assessment & Plan:

## 2012-07-22 NOTE — Patient Instructions (Signed)
Please schedule fasting labs prior to next visit Cbc, chem7, a1c, urine microalbumin-250.0, lipid/lft-272.4 and psa-prostate cancer screening

## 2012-08-01 ENCOUNTER — Other Ambulatory Visit: Payer: Self-pay | Admitting: Internal Medicine

## 2012-08-31 ENCOUNTER — Other Ambulatory Visit: Payer: Self-pay | Admitting: Internal Medicine

## 2012-09-15 ENCOUNTER — Other Ambulatory Visit: Payer: Self-pay | Admitting: Internal Medicine

## 2012-09-15 NOTE — Telephone Encounter (Signed)
Rx done. 

## 2012-10-03 ENCOUNTER — Other Ambulatory Visit: Payer: Self-pay | Admitting: Internal Medicine

## 2012-10-20 ENCOUNTER — Encounter (HOSPITAL_COMMUNITY): Payer: Self-pay | Admitting: *Deleted

## 2012-10-20 ENCOUNTER — Emergency Department (HOSPITAL_COMMUNITY)
Admission: EM | Admit: 2012-10-20 | Discharge: 2012-10-20 | Disposition: A | Discharge status: Home / Self Care | Payer: 59 | Attending: Emergency Medicine | Admitting: Emergency Medicine

## 2012-10-20 ENCOUNTER — Telehealth: Payer: Self-pay | Admitting: Internal Medicine

## 2012-10-20 DIAGNOSIS — Z9889 Other specified postprocedural states: Secondary | ICD-10-CM | POA: Insufficient documentation

## 2012-10-20 DIAGNOSIS — E785 Hyperlipidemia, unspecified: Secondary | ICD-10-CM | POA: Insufficient documentation

## 2012-10-20 DIAGNOSIS — Z7982 Long term (current) use of aspirin: Secondary | ICD-10-CM | POA: Insufficient documentation

## 2012-10-20 DIAGNOSIS — I10 Essential (primary) hypertension: Secondary | ICD-10-CM | POA: Insufficient documentation

## 2012-10-20 DIAGNOSIS — G510 Bell's palsy: Secondary | ICD-10-CM | POA: Insufficient documentation

## 2012-10-20 DIAGNOSIS — I2510 Atherosclerotic heart disease of native coronary artery without angina pectoris: Secondary | ICD-10-CM | POA: Insufficient documentation

## 2012-10-20 DIAGNOSIS — Z87891 Personal history of nicotine dependence: Secondary | ICD-10-CM | POA: Insufficient documentation

## 2012-10-20 DIAGNOSIS — Z79899 Other long term (current) drug therapy: Secondary | ICD-10-CM | POA: Insufficient documentation

## 2012-10-20 MED ORDER — ARTIFICIAL TEARS OP OINT
TOPICAL_OINTMENT | Freq: Once | OPHTHALMIC | Status: DC
  Filled 2012-10-20: qty 3.5

## 2012-10-20 MED ORDER — PREDNISONE 20 MG PO TABS: 60.0000 mg | ORAL_TABLET | Freq: Every day | ORAL | Status: DC

## 2012-10-20 MED ORDER — ACYCLOVIR 200 MG PO CAPS
400.0000 mg | ORAL_CAPSULE | Freq: Every day | ORAL | Status: DC
  Filled 2012-10-20 (×5): qty 2

## 2012-10-20 MED ORDER — ACYCLOVIR 200 MG PO CAPS: 400.0000 mg | ORAL_CAPSULE | Freq: Every day | ORAL | Status: DC

## 2012-10-20 MED ORDER — ARTIFICIAL TEARS OP OINT: TOPICAL_OINTMENT | Freq: Three times a day (TID) | OPHTHALMIC | Status: DC

## 2012-10-20 MED ORDER — PREDNISONE 20 MG PO TABS
60.0000 mg | ORAL_TABLET | Freq: Every day | ORAL | Status: DC
  Administered 2012-10-20: 60 mg via ORAL
  Filled 2012-10-20 (×2): qty 3

## 2012-10-20 NOTE — ED Notes (Addendum)
Pt reports facial weakness on left side noticed when woke yesterday.  States had stiff neck on Tuesday night and felt like something bit him on left side of neck.  Woke and neck still stiff and left side of face felt strange.  Pt states when he drinks liquid comes out of left side of mouth.  Pt unable to close left eye.  No fever, chest pain sob.  Pt reports no pain. Pt has 4 stents in heart placed in 9.  Pt alert oriented X4

## 2012-10-20 NOTE — Telephone Encounter (Signed)
Patient called wanting an acute appointment.  Said his face is numb on one side.  Spoke with Dr Rodena Medin, he advised to tell the patient to go to the emergency room or call 911 to be evaluated for a stroke.  Patient expressed understanding

## 2012-10-20 NOTE — ED Notes (Signed)
Patient with onset of left sided facial droop on yesterday.  He states he felt like something bit him on tuesday.  He states that night his neck was stiff.  The next morning, Wed, he noted that his face felt funny and swollen. Patient states he was not having any difficulty swallowing but he did have food running out of his mouth on yesterday.    No diff talking.  He denies any weakness in his extremities

## 2012-10-20 NOTE — ED Provider Notes (Signed)
History     CSN: [id number]  Arrival date & time 10/20/12  1191   First MD Initiated Contact with Patient 10/20/12 [phone]      Chief Complaint  Patient presents with  . Weakness    (Consider location/radiation/quality/duration/timing/severity/associated sxs/prior treatment) HPI CC: Facial droop  Facial Droop: Sensation of fullness in neck Tuesday night. Monday pt had stressful day and felt like fever blister might be coming on. Woke up Wed w/ feeling of fullness and lopsided on Wed w/ persistent stiff neck. Worsened over the course of the day w/ L sided facial droop and inability to close eye. Unable to keep fluids in mouth yesterday evening. H/o 3 cardiac stents placed 75yrs ago. Denies fever, n/v/d/c, rash, HA, syncope, lightheadedness, muscle weakness.  Past Medical History  Diagnosis Date  . CAD (coronary artery disease)     post stent X 3 to LAD  . Hypertension   . Dyslipidemia     Past Surgical History  Procedure Date  . Eye muscle surgery     Family History  Problem Relation Age of Onset  . Coronary artery disease Mother   . Heart attack Mother     early 65's  . Diabetes Mother   . Hypertension Mother   . Cancer Father     lung     History  Substance Use Topics  . Smoking status: Former Games developer  . Smokeless tobacco: Never Used  . Alcohol Use: No      Review of Systems  All other systems reviewed and are negative.    Allergies  Atorvastatin  Home Medications   Current Outpatient Rx  Name Route Sig Dispense Refill  . ASPIRIN 81 MG PO TABS Oral Take 81 mg by mouth daily.      . OMEGA-3 FATTY ACIDS 1000 MG PO CAPS Oral Take 2 g by mouth daily.      Marland Kitchen FA-PYRIDOXINE-CYANCOBALAMIN 2.5-25-2 MG PO TABS Oral Take 1 tablet by mouth daily. 90 each 1  . VITAMIN C 500 MG PO TABS Oral Take 500 mg by mouth daily.        BP 156/92  Pulse 64  Temp 98 F (36.7 C) (Oral)  Resp 16  Ht 5\' 6"  (1.676 m)  Wt 173 lb (78.472 kg)  BMI 27.92 kg/m2  SpO2  97%  Physical Exam  Nursing note and vitals reviewed. Constitutional: He is oriented to person, place, and time. He appears well-developed and well-nourished. No distress.  HENT:  Head: Normocephalic and atraumatic.  Eyes: Pupils are equal, round, and reactive to light.  Neck: Normal range of motion.  Cardiovascular: Normal rate and intact distal pulses.   Pulmonary/Chest: No respiratory distress.  Abdominal: Normal appearance. He exhibits no distension.  Musculoskeletal: Normal range of motion.  Neurological: He is alert and oriented to person, place, and time.       Inability to move L side of face including forehead. Inability to close L eye No tongue deviation Sensation normal in V1-V3 No motor deficits Strength 3+ and symmetrical bilat in UE and LE Proprioception normal Cerebellar fxn normal Romberg normal  Skin: Skin is warm and dry. No rash noted.  Psychiatric: He has a normal mood and affect. His behavior is normal.    ED Course  Procedures (including critical care time)  Labs Reviewed - No data to display No results found.   No diagnosis found.    MDM  56yo M w/ likely Bell's Palsy. No concern for stroke and no further  workup needed. - Acyclovir 400mg  5xDaily - Lacrilube for affected eye - Prednisone 60mg  Qday x5 days        Ozella Rocks, MD 10/20/12 1019

## 2012-10-20 NOTE — ED Provider Notes (Signed)
I saw and evaluated the patient, reviewed the resident's note and I agree with the findings and plan.  Pt with dense L sided bell's palsy, otherwise normal neuro exam. Advised Acyclovir, steroids, lacrilube and PCP followup.  Sharine Cadle B. Bernette Mayers, MD 10/20/12 1007

## 2012-10-30 ENCOUNTER — Other Ambulatory Visit: Payer: Self-pay | Admitting: Internal Medicine

## 2013-01-20 ENCOUNTER — Ambulatory Visit (INDEPENDENT_AMBULATORY_CARE_PROVIDER_SITE_OTHER): Payer: 59 | Admitting: Family

## 2013-01-20 ENCOUNTER — Encounter: Payer: Self-pay | Admitting: Family

## 2013-01-20 VITALS — BP 130/86 | HR 58 | Temp 98.2°F | Resp 16 | Ht 65.0 in | Wt 178.0 lb

## 2013-01-20 DIAGNOSIS — R7309 Other abnormal glucose: Secondary | ICD-10-CM

## 2013-01-20 DIAGNOSIS — I2510 Atherosclerotic heart disease of native coronary artery without angina pectoris: Secondary | ICD-10-CM

## 2013-01-20 DIAGNOSIS — Z23 Encounter for immunization: Secondary | ICD-10-CM

## 2013-01-20 DIAGNOSIS — I10 Essential (primary) hypertension: Secondary | ICD-10-CM

## 2013-01-20 DIAGNOSIS — E785 Hyperlipidemia, unspecified: Secondary | ICD-10-CM

## 2013-01-20 LAB — HEPATIC FUNCTION PANEL
AST: 28 U/L (ref 0–37)
Albumin: 4.6 g/dL (ref 3.5–5.2)
Total Bilirubin: 0.8 mg/dL (ref 0.3–1.2)

## 2013-01-20 LAB — BASIC METABOLIC PANEL
CO2: 28 mEq/L (ref 19–32)
Chloride: 106 mEq/L (ref 96–112)
Potassium: 4.5 mEq/L (ref 3.5–5.3)
Sodium: 142 mEq/L (ref 135–145)

## 2013-01-20 LAB — LIPID PANEL
HDL: 36 mg/dL — ABNORMAL LOW (ref >39)
LDL Cholesterol: 105 mg/dL — ABNORMAL HIGH (ref 0–99)
Total CHOL/HDL Ratio: 4.9 Ratio
VLDL: 35 mg/dL (ref 0–40)

## 2013-01-20 LAB — HEMOGLOBIN A1C: Mean Plasma Glucose: 111 mg/dL (ref <117)

## 2013-01-20 NOTE — Assessment & Plan Note (Addendum)
Obtain A1C.  Urine microalbumin, declines eye exam.  Pneumovax and flu shot today.

## 2013-01-20 NOTE — Progress Notes (Signed)
Subjective:    Patient ID: [name], male    DOB: 1956/07/25, 57 y.o.   MRN: [id number]  HPI  [name] is a 57 yr old male who presents today for follow up.  1) DM2- Last A1C was 5.6. Ths is currently diet controlled.  Last eye exam - many years ago. Declines referral for eye exam.   2) Hyperlipidemia- He is not currently on statin.  He reports that he has been on crestor in the past but stopped due to [place] >1 year ago.    3) HTN- Pt is currently maintained on coreg, altace, HCTZ, and amlodipine.  Review of Systems See HPI  Past Medical History  Diagnosis Date  . CAD (coronary artery disease)     post stent X 3 to LAD  . Hypertension   . Dyslipidemia     History   Social History  . Marital Status: Married    Spouse Name: N/A    Number of Children: N/A  . 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 Smoking status: Former Games developer  . Smokeless tobacco: Never Used  . Alcohol Use: No  . Drug Use: No  . Sexually Active: Not on file   Other Topics Concern  . Not on file   Social History Narrative   Married 20 yrs to his second wife3 children 67, 67, 103    Past Surgical History  Procedure Date  . Eye muscle surgery     Family History  Problem Relation Age of Onset  . Coronary artery disease Mother   . Heart attack Mother     early 39's  . Diabetes Mother   . Hypertension Mother   . Cancer Father     lung     Allergies  Allergen Reactions  . Atorvastatin     REACTION: myalgias    Current Outpatient Prescriptions on File Prior to Visit  Medication Sig Dispense Refill  . amLODipine (NORVASC) 2.5 MG tablet Take 2.5 mg by mouth daily.      Marland Kitchen aspirin 81 MG tablet Take 81 mg by mouth daily.        . carvedilol (COREG) 12.5 MG tablet Take 12.5 mg by mouth 2 (two) times daily with a meal.      . fish oil-omega-3 fatty acids 1000 MG capsule Take 2 g by mouth daily.          . FOLBIC 2.5-25-2 MG TABS TAKE 1 TABLET BY MOUTH EVERY DAY. INS ONLY ALLOWS 30 DAYS AT A TIME  30 tablet  3  . hydrochlorothiazide (HYDRODIURIL) 25 MG tablet Take 25 mg by mouth daily.      . ramipril (ALTACE) 10 MG tablet Take 10 mg by mouth 2 (two) times daily.      . vitamin C (ASCORBIC ACID) 500 MG tablet Take 500 mg by mouth daily.          BP 130/86  Pulse 58  Temp 98.2 F (36.8 C) (Oral)  Resp 16  Ht 5\' 5"  (1.651 m)  Wt 178 lb 0.6 oz (80.758 kg)  BMI 29.63 kg/m2  SpO2 99%       Objective:   Physical Exam  Constitutional: He is oriented to person, place, and time. He appears well-developed and well-nourished. No distress.  HENT:  Head: Normocephalic and atraumatic.  Cardiovascular: Normal rate and regular rhythm.   No  murmur heard. Pulmonary/Chest: Effort normal and breath sounds normal. No respiratory distress. He has no wheezes. He has no rales. He exhibits no tenderness.  Musculoskeletal: He exhibits no edema.  Neurological: He is alert and oriented to person, place, and time.  Psychiatric: He has a normal mood and affect. His behavior is normal. Judgment and thought content normal.          Assessment & Plan:

## 2013-01-20 NOTE — Assessment & Plan Note (Signed)
Pt is fasting.  Has had myalgia with crestor.  Off statin as a result.  We discussed possibility of livalo if lipids above goal.

## 2013-01-20 NOTE — Assessment & Plan Note (Signed)
I reminded him on the importance of scheduling follow up with cardiology.  Pt verbalizes understanding.

## 2013-01-20 NOTE — Assessment & Plan Note (Signed)
BP Readings from Last 3 Encounters:  01/20/13 130/86  10/20/12 156/92  07/22/12 134/94   BP stable. Continue current meds.  Obtain bmet.

## 2013-01-20 NOTE — Patient Instructions (Addendum)
Please complete your blood work prior to leaving today. Schedule follow up with cardiology. Follow up here in 3 months.

## 2013-02-07 ENCOUNTER — Telehealth: Payer: Self-pay | Admitting: *Deleted

## 2013-02-07 NOTE — Telephone Encounter (Signed)
Message copied by Kathi Simpers on Tue Feb 07, 2013  1:35 PM ------      Message from: O'SULLIVAN, MELISSA      Created: Sun Jan 22, 2013  4:18 PM       Cholesterol is not far from goal.  Recommend regular exercise, low fat/low cholesterol diet, avoid concentrated sweets. Sugar appears well controlled.  One of the LFT's slightly elevated.  Recommend follow up lft in 1 month.  Dx elevated LFT. ------

## 2013-02-07 NOTE — Telephone Encounter (Signed)
Left detailed message on pt's cell# re: instructions below. Advised pt to return to the lab in 1 month for repeat LFTS and to call if any questions. Future order entered and given to the lab.

## 2013-03-05 ENCOUNTER — Other Ambulatory Visit: Payer: Self-pay | Admitting: Internal Medicine

## 2013-03-16 ENCOUNTER — Other Ambulatory Visit: Payer: Self-pay | Admitting: Internal Medicine

## 2013-03-17 NOTE — Telephone Encounter (Signed)
Rx request to pharmacy/SLS  

## 2013-03-19 ENCOUNTER — Other Ambulatory Visit: Payer: Self-pay | Admitting: Internal Medicine

## 2013-04-04 ENCOUNTER — Other Ambulatory Visit: Payer: Self-pay | Admitting: Internal Medicine

## 2013-04-04 NOTE — Telephone Encounter (Signed)
Denial--Last Rx 03.09.14 #180x1-Too soon for refill request/SLS

## 2013-04-07 ENCOUNTER — Other Ambulatory Visit: Payer: Self-pay | Admitting: Internal Medicine

## 2013-04-07 NOTE — Telephone Encounter (Signed)
Denial--Too soon-Last Rx 03.09.14 #180x1-pt should have available refill/SLS

## 2013-04-21 ENCOUNTER — Ambulatory Visit: Payer: 59 | Admitting: Family

## 2013-05-19 ENCOUNTER — Ambulatory Visit: Payer: 59 | Admitting: Family

## 2013-06-02 ENCOUNTER — Ambulatory Visit (INDEPENDENT_AMBULATORY_CARE_PROVIDER_SITE_OTHER): Payer: 59 | Admitting: Family

## 2013-06-02 ENCOUNTER — Encounter: Payer: Self-pay | Admitting: Family

## 2013-06-02 VITALS — BP 144/100 | HR 61 | Temp 97.8°F | Resp 16 | Ht 65.0 in | Wt 175.0 lb

## 2013-06-02 DIAGNOSIS — I10 Essential (primary) hypertension: Secondary | ICD-10-CM

## 2013-06-02 MED ORDER — OLMESARTAN-AMLODIPINE-HCTZ 20-5-12.5 MG PO TABS: 1.0000 | ORAL_TABLET | Freq: Every day | ORAL | Status: DC

## 2013-06-02 NOTE — Progress Notes (Signed)
Subjective:    Patient ID: [name], male    DOB: Mar 20, 1956, 57 y.o.   MRN: [id number]  HPI  [name] is a 57 yr old male who presents today for follow up of multiple medical problems.  1) Hyperlipidemia- his last LDL was 105 and this was obtained in January.    2) Hyperglycemia- last A1C in January was normal at 5.5.    3) HTN-  Currently maintained on ramipril, hctz, amlodipine and coreg.  BP remains elevated.  Denies CP/SOB/swelling HA    Review of Systems See HPI  Past Medical History  Diagnosis Date  . CAD (coronary artery disease)     post stent X 3 to LAD  . Hypertension   . Dyslipidemia     History   Social History  . Marital Status: Married    Spouse Name: N/A    Number of Children: N/A  . 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 Smoking status: Former Games developer  . Smokeless tobacco: Never Used  . Alcohol Use: No  . Drug Use: No  . Sexually Active: Not on file   Other Topics Concern  . Not on file   Social History Narrative   Married 20 yrs to his second wife   3 children 4, 50, 75    Past Surgical History  Procedure Laterality Date  . Eye muscle surgery      Family History  Problem Relation Age of Onset  . Coronary artery disease Mother   . Heart attack Mother     early 88's  . Diabetes Mother   . Hypertension Mother   . Cancer Father     lung     Allergies  Allergen Reactions  . Atorvastatin     REACTION: myalgias    Current Outpatient Prescriptions on File Prior to Visit  Medication Sig Dispense Refill  . amLODipine (NORVASC) 2.5 MG tablet TAKE 1 TABLET BY MOUTH EVERY DAY  90 tablet  1  . aspirin 81 MG tablet Take 81 mg by mouth daily.        . carvedilol (COREG) 12.5 MG tablet Take 12.5 mg by mouth 2 (two) times daily with a meal.      . fish oil-omega-3 fatty acids 1000 MG capsule Take 2 g by mouth daily.        . FOLBIC 2.5-25-2 MG TABS  TAKE 1 TABLET BY MOUTH EVERY DAY. INS ONLY ALLOWS 30 DAYS AT A TIME  30 tablet  3  . hydrochlorothiazide (HYDRODIURIL) 25 MG tablet TAKE 1 TABLET (25 MG TOTAL) BY MOUTH DAILY.  90 tablet  1  . ramipril (ALTACE) 10 MG capsule TAKE 1 CAPSULE (10 MG TOTAL) BY MOUTH 2 (TWO) TIMES DAILY.  180 capsule  1  . vitamin C (ASCORBIC ACID) 500 MG tablet Take 500 mg by mouth daily.         No current facility-administered medications on file prior to visit.    BP 144/100  Pulse 61  Temp(Src) 97.8 F (36.6 C) (Oral)  Resp 16  Ht 5\' 5"  (1.651 m)  Wt 175 lb 0.6 oz (79.398 kg)  BMI 29.13 kg/m2  SpO2 99%       Objective:   Physical Exam  Constitutional: He is oriented to person, place, and time. He appears well-developed and well-nourished. No distress.  HENT:  Head: Normocephalic and atraumatic.  Cardiovascular: Normal rate and regular rhythm.   No murmur heard. Pulmonary/Chest: Effort normal and breath sounds normal. No respiratory distress. He has no wheezes. He has no rales. He exhibits no tenderness.  Neurological: He is alert and oriented to person, place, and time.  Skin: Skin is warm and dry.  Psychiatric: He has a normal mood and affect. His behavior is normal. Judgment and thought content normal.          Assessment & Plan:

## 2013-06-02 NOTE — Assessment & Plan Note (Signed)
Uncontrolled. Will stop amlodipine, ramipril, hctz, start tribenzor, follow up in 2 weeks. Will plan BMET, A1C at that time.

## 2013-06-02 NOTE — Patient Instructions (Addendum)
Stop ramipril, amlodipine,  Hctz. 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 Follow up in 2 weeks.

## 2013-06-19 ENCOUNTER — Ambulatory Visit (INDEPENDENT_AMBULATORY_CARE_PROVIDER_SITE_OTHER): Payer: 59 | Admitting: Family

## 2013-06-19 ENCOUNTER — Encounter: Payer: Self-pay | Admitting: Family

## 2013-06-19 VITALS — BP 110/90 | HR 57 | Temp 98.4°F | Resp 16 | Ht 65.0 in | Wt 175.1 lb

## 2013-06-19 DIAGNOSIS — I10 Essential (primary) hypertension: Secondary | ICD-10-CM

## 2013-06-19 LAB — BASIC METABOLIC PANEL
CO2: 27 mEq/L (ref 19–32)
Calcium: 9.5 mg/dL (ref 8.4–10.5)
Chloride: 105 mEq/L (ref 96–112)
Potassium: 4.4 mEq/L (ref 3.5–5.3)
Sodium: 143 mEq/L (ref 135–145)

## 2013-06-19 MED ORDER — OLMESARTAN-AMLODIPINE-HCTZ 20-5-12.5 MG PO TABS: 1.0000 | ORAL_TABLET | Freq: Every day | ORAL | Status: DC

## 2013-06-19 NOTE — Patient Instructions (Addendum)
Please complete your lab work prior to leaving. Please schedule a follow up appointment in 3 months.  

## 2013-06-19 NOTE — Progress Notes (Signed)
  Subjective:    Patient ID: [name], male    DOB: 10/06/1956, 57 y.o.   MRN: [id number]  HPI  [name] is a 57 yr old male who presents today for follow up of his HTN.  He is tolerating tribenzor without difficulty. Notes possibly "a little sluggish."  Denies CP/SOB swelling.     Review of Systems See HPI  Past Medical History  Diagnosis Date  . CAD (coronary artery disease)     post stent X 3 to LAD  . Hypertension   . Dyslipidemia     History   Social History  . Marital Status: Married    Spouse Name: N/A    Number of Children: N/A  . 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 Smoking status: Former Games developer  . Smokeless tobacco: Never Used  . Alcohol Use: No  . Drug Use: No  . Sexually Active: Not on file   Other Topics Concern  . Not on file   Social History Narrative   Married 20 yrs to his second wife   3 children 61, 79, 9    Past Surgical History  Procedure Laterality Date  . Eye muscle surgery      Family History  Problem Relation Age of Onset  . Coronary artery disease Mother   . Heart attack Mother     early 55's  . Diabetes Mother   . Hypertension Mother   . Cancer Father     lung     Allergies  Allergen Reactions  . Atorvastatin     REACTION: myalgias    Current Outpatient Prescriptions on File Prior to Visit  Medication Sig Dispense Refill  . aspirin 81 MG tablet Take 81 mg by mouth daily.        . carvedilol (COREG) 12.5 MG tablet Take 12.5 mg by mouth 2 (two) times daily with a meal.      . fish oil-omega-3 fatty acids 1000 MG capsule Take 2 g by mouth daily.        . FOLBIC 2.5-25-2 MG TABS TAKE 1 TABLET BY MOUTH EVERY DAY. INS ONLY ALLOWS 30 DAYS AT A TIME  30 tablet  3  . vitamin C (ASCORBIC ACID) 500 MG tablet Take 500 mg by mouth daily.         No current facility-administered medications on file prior to visit.    BP 110/90  Pulse  57  Temp(Src) 98.4 F (36.9 C) (Oral)  Resp 16  Ht 5\' 5"  (1.651 m)  Wt 175 lb 1.9 oz (79.434 kg)  BMI 29.14 kg/m2  SpO2 97%       Objective:   Physical Exam  Constitutional: He is oriented to person, place, and time. He appears well-developed and well-nourished. No distress.  HENT:  Head: Normocephalic and atraumatic.  Cardiovascular: Normal rate and regular rhythm.   No murmur heard. Pulmonary/Chest: Effort normal and breath sounds normal. No respiratory distress. He has no wheezes. He has no rales. He exhibits no tenderness.  Lymphadenopathy:    He has no cervical adenopathy.  Neurological: He is alert and oriented to person, place, and time.  Psychiatric: He has a normal mood and affect. His behavior is normal. Judgment and thought content normal.          Assessment & Plan:

## 2013-06-19 NOTE — Assessment & Plan Note (Addendum)
BP Readings from Last 3 Encounters:  06/19/13 110/90  06/02/13 144/100  01/20/13 130/86   BP looks much better today. Continue current dose of tribenzor (7 tabs of samples provided).  Check bmet.  Follow up in 3 months.

## 2013-06-20 ENCOUNTER — Encounter: Payer: Self-pay | Admitting: Family

## 2013-07-04 ENCOUNTER — Other Ambulatory Visit: Payer: Self-pay | Admitting: Family

## 2013-07-04 DIAGNOSIS — E538 Deficiency of other specified B group vitamins: Secondary | ICD-10-CM

## 2013-07-06 ENCOUNTER — Other Ambulatory Visit: Payer: Self-pay | Admitting: Family

## 2013-07-06 NOTE — Telephone Encounter (Signed)
Medication had to be linked to patient, refill error/SLS

## 2013-07-26 ENCOUNTER — Other Ambulatory Visit: Payer: Self-pay | Admitting: Family

## 2013-07-26 NOTE — Telephone Encounter (Signed)
Rx request to pharmacy/SLS  

## 2013-09-29 ENCOUNTER — Ambulatory Visit (INDEPENDENT_AMBULATORY_CARE_PROVIDER_SITE_OTHER): Payer: 59 | Admitting: Family

## 2013-09-29 ENCOUNTER — Encounter: Payer: Self-pay | Admitting: Family

## 2013-09-29 VITALS — BP 138/90 | HR 56 | Temp 98.0°F | Resp 16 | Ht 65.0 in | Wt 174.0 lb

## 2013-09-29 DIAGNOSIS — I2510 Atherosclerotic heart disease of native coronary artery without angina pectoris: Secondary | ICD-10-CM

## 2013-09-29 DIAGNOSIS — I10 Essential (primary) hypertension: Secondary | ICD-10-CM

## 2013-09-29 DIAGNOSIS — R739 Hyperglycemia, unspecified: Secondary | ICD-10-CM

## 2013-09-29 DIAGNOSIS — E785 Hyperlipidemia, unspecified: Secondary | ICD-10-CM

## 2013-09-29 DIAGNOSIS — I25810 Atherosclerosis of coronary artery bypass graft(s) without angina pectoris: Secondary | ICD-10-CM

## 2013-09-29 DIAGNOSIS — Z23 Encounter for immunization: Secondary | ICD-10-CM

## 2013-09-29 DIAGNOSIS — R7309 Other abnormal glucose: Secondary | ICD-10-CM

## 2013-09-29 LAB — BASIC METABOLIC PANEL
CO2: 29 mEq/L (ref 19–32)
Chloride: 103 mEq/L (ref 96–112)
Creat: 0.96 mg/dL (ref 0.50–1.35)

## 2013-09-29 LAB — LIPID PANEL
HDL: 35 mg/dL — ABNORMAL LOW (ref >39)
LDL Cholesterol: 108 mg/dL — ABNORMAL HIGH (ref 0–99)
Total CHOL/HDL Ratio: 5.1 Ratio

## 2013-09-29 LAB — HEMOGLOBIN A1C: Hgb A1c MFr Bld: 5.6 % (ref <5.7)

## 2013-09-29 NOTE — Progress Notes (Signed)
Subjective:    Patient ID: [name], male    DOB: Jul 04, 1956, 57 y.o.   MRN: [id number]  HPI  Mr. Schwenke is a 57 yr old male who presents today for follow up of multiple medical problems:  1) HTN- currently maintained on amlodipine 2.5, hctz 25mg .    2) Borderline DM- this is currently diet controlled.  Last A1C on file was 5.5 in January.    3) CAD- pt has hx of stent x 3 to LAD.  He follows with Dr. Excell Seltzer.  He denies CP/SOB or swelling.  Review of Systems See HPI  Past Medical History  Diagnosis Date  . CAD (coronary artery disease)     post stent X 3 to LAD  . Hypertension   . Dyslipidemia     History   Social History  . Marital Status: Married    Spouse Name: N/A    Number of Children: N/A  . 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 Smoking status: Former Games developer  . Smokeless tobacco: Never Used  . Alcohol Use: No  . Drug Use: No  . Sexual Activity: Not on file   Other Topics Concern  . Not on file   Social History Narrative   Married 20 yrs to his second wife   3 children 70, 39, 70    Past Surgical History  Procedure Laterality Date  . Eye muscle surgery      Family History  Problem Relation Age of Onset  . Coronary artery disease Mother   . Heart attack Mother     early 69's  . Diabetes Mother   . Hypertension Mother   . Cancer Father     lung     Allergies  Allergen Reactions  . Atorvastatin     REACTION: myalgias  . Tribenzor [Olmesartan-Amlodipine-Hctz] Other (See Comments)    Joint pain    Current Outpatient Prescriptions on File Prior to Visit  Medication Sig Dispense Refill  . aspirin 81 MG tablet Take 81 mg by mouth daily.        . carvedilol (COREG) 12.5 MG tablet Take 12.5 mg by mouth 2 (two) times daily with a meal.      . fish oil-omega-3 fatty acids 1000 MG capsule Take 2 g by mouth daily.        . folic acid-pyridoxine-cyancobalamin  (FOLBIC) 2.5-25-2 MG TABS TAKE 1 TABLET BY MOUTH DAILY. INSURANCE ALLOWS 30 DAYS AT A TIME.  30 tablet  3  . vitamin C (ASCORBIC ACID) 500 MG tablet Take 500 mg by mouth daily.         No current facility-administered medications on file prior to visit.    BP 138/90  Pulse 56  Temp(Src) 98 F (36.7 C) (Oral)  Resp 16  Ht 5\' 5"  (1.651 m)  Wt 174 lb 0.6 oz (78.944 kg)  BMI 28.96 kg/m2  SpO2 99%       Objective:   Physical Exam  Constitutional: He is oriented to person, place, and time. He appears well-developed and well-nourished. No distress.  HENT:  Head: Normocephalic and atraumatic.  Cardiovascular: Normal rate and regular rhythm.   No murmur heard. Pulses:      Dorsalis pedis pulses are 2+ on the right side, and 2+ on the left side.  Pulmonary/Chest: Effort normal and breath sounds normal. No respiratory distress. He has no  wheezes. He has no rales. He exhibits no tenderness.  Musculoskeletal: He exhibits no edema.  Neurological: He is alert and oriented to person, place, and time.  Psychiatric: He has a normal mood and affect. His behavior is normal. Judgment and thought content normal.          Assessment & Plan:

## 2013-09-29 NOTE — Assessment & Plan Note (Signed)
BP Readings from Last 3 Encounters:  09/29/13 138/90  06/19/13 110/90  06/02/13 144/100   BP control stable.  Continue current meds.

## 2013-09-29 NOTE — Assessment & Plan Note (Signed)
Check A1C.  Diet controled.  He has started exercising.  Flu shot today.

## 2013-09-29 NOTE — Assessment & Plan Note (Signed)
LDL slightly above goal last visit.  Repeat LDL.

## 2013-09-29 NOTE — Assessment & Plan Note (Signed)
Clinically stable. On ADA, beta blocker. Past due for follow up with cardiology. Will arrange.

## 2013-09-29 NOTE — Patient Instructions (Addendum)
Please complete lab work prior to leaving. You will be contacted about your referral to Dr. Excell Seltzer. Follow up in 3 months.

## 2013-10-01 ENCOUNTER — Telehealth: Payer: Self-pay | Admitting: Family

## 2013-10-01 DIAGNOSIS — E785 Hyperlipidemia, unspecified: Secondary | ICD-10-CM

## 2013-10-01 MED ORDER — EZETIMIBE 10 MG PO TABS: 10.0000 mg | ORAL_TABLET | Freq: Every day | ORAL | Status: DC

## 2013-10-01 NOTE — Telephone Encounter (Signed)
ldl and trigs above goal. Start zetia. Plan to repeat lft lipids in 3 months. contine to work on diet and exercise.

## 2013-10-02 NOTE — Telephone Encounter (Signed)
Notified pt and he voices understanding. Lab order entered. 

## 2013-10-02 NOTE — Addendum Note (Signed)
Addended by: Mervin Kung A on: 10/02/2013 03:57 PM   Modules accepted: Orders

## 2013-11-15 ENCOUNTER — Encounter (INDEPENDENT_AMBULATORY_CARE_PROVIDER_SITE_OTHER): Payer: Self-pay

## 2013-11-15 ENCOUNTER — Encounter: Payer: Self-pay | Admitting: Cardiovascular Disease

## 2013-11-15 ENCOUNTER — Ambulatory Visit (INDEPENDENT_AMBULATORY_CARE_PROVIDER_SITE_OTHER): Payer: 59 | Admitting: Cardiovascular Disease

## 2013-11-15 VITALS — BP 130/100 | HR 61 | Ht 65.0 in

## 2013-11-15 DIAGNOSIS — I2510 Atherosclerotic heart disease of native coronary artery without angina pectoris: Secondary | ICD-10-CM

## 2013-11-15 DIAGNOSIS — E785 Hyperlipidemia, unspecified: Secondary | ICD-10-CM

## 2013-11-15 MED ORDER — AMLODIPINE BESYLATE 5 MG PO TABS: 5.0000 mg | ORAL_TABLET | Freq: Every day | ORAL | Status: DC

## 2013-11-15 MED ORDER — ROSUVASTATIN CALCIUM 5 MG PO TABS: ORAL_TABLET | ORAL | Status: DC

## 2013-11-15 NOTE — Patient Instructions (Addendum)
Your physician has recommended you make the following change in your medication: INCREASE Amlodipine 5mg  take one by mouth daily, START Crestor 5mg  take one by mouth on Monday, Wednesday and Friday  Your physician recommends that you return for a FASTING LIPID and LIVER profile in 3 MONTHS--nothing to eat or drink after midnight, lab opens at 7:30 am (the pt would like to have this drawn in PCP office)  Your physician wants you to follow-up in: 1 YEAR with Dr Excell Seltzer.  You will receive a reminder letter in the mail two months in advance. If you don't receive a letter, please call our office to schedule the follow-up appointment.

## 2013-11-15 NOTE — Progress Notes (Signed)
HPI:  57 year old gentleman presenting for cardiac evaluation. He was last seen here in 2008. He has been followed for coronary artery disease and underwent PCI of the LAD in 2002. At that time he presented with chest pain and had an abnormal stress test demonstrating anterior ischemia. He was found to have severe LAD stenosis and was ultimately treated with 3 bare-metal stents in the mid and distal LAD. Lipids were last checked 09/29/2013 with a cholesterol of 180, triglycerides 186, HDL 35, and LDL 108.  The patient is doing well from a symptomatic perspective. Back in 2002 prior to undergoing PCI he had very typical angina with exertion. He's had no exertional chest pain since that time. He is physically active and walks regularly for exercise. He denies chest pain, chest pressure, shortness of breath, or lightheadedness. He has no complaints today.  He's been unable to tolerate statin drugs. He's tried 3 different agents and has had myalgias with each of them. He also has a long history of white coat hypertension. He's not been checking his blood pressures at home recently.  Outpatient Encounter Prescriptions as of 11/15/2013  Medication Sig  . amLODipine (NORVASC) 2.5 MG tablet Take 2.5 mg by mouth daily.  Marland Kitchen aspirin 81 MG tablet Take 81 mg by mouth daily.    . carvedilol (COREG) 12.5 MG tablet Take 12.5 mg by mouth 2 (two) times daily with a meal.  . fish oil-omega-3 fatty acids 1000 MG capsule Take 2 g by mouth daily.    . folic acid-pyridoxine-cyancobalamin (FOLBIC) 2.5-25-2 MG TABS TAKE 1 TABLET BY MOUTH DAILY. INSURANCE ALLOWS 30 DAYS AT A TIME.  . hydrochlorothiazide (HYDRODIURIL) 25 MG tablet Take 25 mg by mouth daily.  . ramipril (ALTACE) 10 MG capsule Take 10 mg by mouth daily.  . vitamin C (ASCORBIC ACID) 500 MG tablet Take 500 mg by mouth daily.    . [DISCONTINUED] ezetimibe (ZETIA) 10 MG tablet Take 1 tablet (10 mg total) by mouth daily.    Atorvastatin and Tribenzor  Past  Medical History  Diagnosis Date  . CAD (coronary artery disease)     post stent X 3 to LAD  . Hypertension   . Dyslipidemia     Past Surgical History  Procedure Laterality Date  . Eye muscle surgery      History   Social History  . Marital Status: Married    Spouse Name: N/A    Number of Children: N/A  . 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 Smoking status: Former Games developer  . Smokeless tobacco: Never Used  . Alcohol Use: No  . Drug Use: No  . Sexual Activity: Not on file   Other Topics Concern  . Not on file   Social History Narrative   Married 20 yrs to his second wife   3 children 28, 14, 36    Family History  Problem Relation Age of Onset  . Coronary artery disease Mother   . Heart attack Mother     early 75's  . Diabetes Mother   . Hypertension Mother   . Cancer Father     lung     ROS:  General: no fevers/chills/night sweats Eyes: no blurry vision, diplopia, or amaurosis ENT: no sore throat or hearing loss Resp: no cough, wheezing, or hemoptysis CV: no edema or palpitations GI: no abdominal pain, nausea, vomiting, diarrhea, or  constipation GU: no dysuria, frequency, or hematuria Skin: no rash Neuro: no headache, numbness, tingling, or weakness of extremities Musculoskeletal: no joint pain or swelling Heme: no bleeding, DVT, or easy bruising Endo: no polydipsia or polyuria  BP 130/100  Pulse 61  Ht 5\' 5"  (1.651 m)  PHYSICAL EXAM: Pt is alert and oriented, WD, WN, in no distress. HEENT: normal Neck: JVP normal. Carotid upstrokes normal without bruits. No thyromegaly. Lungs: equal expansion, clear bilaterally CV: Apex is discrete and nondisplaced, RRR without murmur or gallop Abd: soft, NT, +BS, no bruit, no hepatosplenomegaly Back: no CVA tenderness Ext: no C/C/E        DP/PT pulses intact and = Skin: warm and dry without rash Neuro: CNII-XII intact              Strength intact = bilaterally  EKG:  Sinus rhythm 61 beats per minute, within normal limits.  ASSESSMENT AND PLAN: 1. Coronary atherosclerosis, native vessel. The patient is stable with no symptoms of angina. He is exercising regularly. See below for discussion of further risk reduction. He is treated with a beta blocker, ACE inhibitor, and aspirin.  2. Hypertension with suboptimal control. Recommended increasing amlodipine to 5 mg daily. He will continue on carvedilol, hydrochlorothiazide, and ramipril.  3. Hyperlipidemia. He's been statin intolerant. I'm going to try him on Crestor 5 mg 3 days weekly. Will arrange followup lipids and LFTs in 3 months.  For followup evaluation of like to see him back in one year.  Tonny Bollman 11/15/2013 11:37 AM

## 2013-12-13 ENCOUNTER — Other Ambulatory Visit: Payer: Self-pay | Admitting: Family

## 2014-01-01 ENCOUNTER — Ambulatory Visit: Payer: 59 | Admitting: Family

## 2014-01-01 ENCOUNTER — Telehealth: Payer: Self-pay | Admitting: Family

## 2014-01-01 NOTE — Telephone Encounter (Signed)
folbic 2.5-25-2mg  take 1 tablet by mouth daily

## 2014-01-02 MED ORDER — FA-PYRIDOXINE-CYANOCOBALAMIN 2.5-25-2 MG PO TABS: ORAL_TABLET | ORAL | Status: DC

## 2014-01-02 NOTE — Telephone Encounter (Signed)
Refill sent to pharmacy.   

## 2014-01-22 ENCOUNTER — Other Ambulatory Visit: Payer: Self-pay | Admitting: Family

## 2014-01-25 ENCOUNTER — Other Ambulatory Visit: Payer: Self-pay | Admitting: Family

## 2014-01-25 NOTE — Telephone Encounter (Signed)
carvedilol (COREG) 12.5 MG tablet 180 tablet 0 01/22/2014 Sig: TAKE 1 TABLET BY MOUTH TWICE A DAY WITH A MEAL E-Prescribing Status: Receipt confirmed by pharmacy (01/22/2014 6:42 PM EST) Rx request Denied-Too Soon/SLS

## 2014-01-29 ENCOUNTER — Ambulatory Visit (INDEPENDENT_AMBULATORY_CARE_PROVIDER_SITE_OTHER): Payer: 59 | Admitting: Family

## 2014-01-29 ENCOUNTER — Encounter: Payer: Self-pay | Admitting: Family

## 2014-01-29 VITALS — BP 118/94 | HR 62 | Temp 98.4°F | Resp 12 | Ht 65.0 in | Wt 180.0 lb

## 2014-01-29 DIAGNOSIS — E785 Hyperlipidemia, unspecified: Secondary | ICD-10-CM

## 2014-01-29 DIAGNOSIS — I2510 Atherosclerotic heart disease of native coronary artery without angina pectoris: Secondary | ICD-10-CM

## 2014-01-29 DIAGNOSIS — I10 Essential (primary) hypertension: Secondary | ICD-10-CM

## 2014-01-29 LAB — HEPATIC FUNCTION PANEL
ALBUMIN: 4.6 g/dL (ref 3.5–5.2)
ALT: 48 U/L (ref 0–53)
AST: 21 U/L (ref 0–37)
Alkaline Phosphatase: 60 U/L (ref 39–117)
BILIRUBIN DIRECT: 0.1 mg/dL (ref 0.0–0.3)
Indirect Bilirubin: 0.4 mg/dL (ref 0.2–1.2)
TOTAL PROTEIN: 6.9 g/dL (ref 6.0–8.3)
Total Bilirubin: 0.5 mg/dL (ref 0.2–1.2)

## 2014-01-29 LAB — LIPID PANEL
CHOL/HDL RATIO: 4.4 ratio
CHOLESTEROL: 162 mg/dL (ref 0–200)
HDL: 37 mg/dL — AB (ref >39)
LDL Cholesterol: 92 mg/dL (ref 0–99)
Triglycerides: 165 mg/dL — ABNORMAL HIGH (ref <150)
VLDL: 33 mg/dL (ref 0–40)

## 2014-01-29 NOTE — Progress Notes (Signed)
Pre visit review using our clinic review tool, if applicable. No additional management support is needed unless otherwise documented below in the visit note. 

## 2014-01-29 NOTE — Assessment & Plan Note (Addendum)
BP stable today. Continue current medications. Follow up in four months.

## 2014-01-29 NOTE — Patient Instructions (Signed)
Please complete lab work prior to leaving today. Follow up in four months or sooner if needed.

## 2014-01-29 NOTE — Assessment & Plan Note (Addendum)
Will obtain LFT's and Lipid panel today, patient fasting. Follow up in four months.

## 2014-01-29 NOTE — Assessment & Plan Note (Signed)
Pt is following with cardiology- Dr. Burt Knack.  Clinically stable. Defer management to cardiology.

## 2014-01-29 NOTE — Progress Notes (Signed)
Subjective:    Patient ID: [name], male    DOB: 09-10-1956, 58 y.o.   MRN: [id number]  Hypertension Pertinent negatives include no chest pain, headaches or shortness of breath.  Hyperlipidemia Associated symptoms include myalgias. Pertinent negatives include no chest pain or shortness of breath.  Hyperglycemia Associated symptoms include myalgias. Pertinent negatives include no chest pain, coughing, fever, headaches or weakness.  Coronary Artery Disease Pertinent negatives include no chest pain, leg swelling or shortness of breath. Risk factors include hyperlipidemia and hypertension.   [name] is a 58 year old who presents for follow up.  Hyperlipidemia) Started on Zetia in October 2014, cardiologist discontinued Zetia and patient started taking Crestor. Patient reports intermittent muscle aches and is taking Crestor now twice weekly.  Patient denies regular intake of fresh fruits and vegetables and eats when he's on the road.  Patient reports he walks a lot on the job.  Hypertension) BP Readings from Last 3 Encounters:  01/29/14 118/94  11/15/13 130/100  09/29/13 138/90   Patient compliant with medications, recent increase in amlodipine per cardiology.  Denies chest pain, ankle edema, and shortness of breath.  Review of Systems  Constitutional: Negative for fever.  HENT: Negative for rhinorrhea.   Respiratory: Negative for cough and shortness of breath.   Cardiovascular: Negative for chest pain and leg swelling.  Musculoskeletal: Positive for myalgias.       Patient reports intermittent myalgias and is taking Crestor twice weekly.  Neurological: Negative for weakness and headaches.  Psychiatric/Behavioral: Negative for sleep disturbance.   Past Medical History  Diagnosis Date  . CAD (coronary artery disease)     post stent X 3 to LAD  . Hypertension   . Dyslipidemia     History   Social History  . Marital Status: Married    Spouse Name: N/A    Number of  Children: N/A  . 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 Smoking status: Former Smoker    Start date: 01/29/1974  . Smokeless tobacco: Former Systems developer    Quit date: 01/30/1984  . Alcohol Use: No  . Drug Use: No  . Sexual Activity: Not on file   Other Topics Concern  . Not on file   Social History Narrative   Married 20 yrs to his second wife   3 children 67, 83, 18    Past Surgical History  Procedure Laterality Date  . Eye muscle surgery      Family History  Problem Relation Age of Onset  . Coronary artery disease Mother   . Heart attack Mother     early 60's  . Diabetes Mother   . Hypertension Mother   . Cancer Father     lung     Allergies  Allergen Reactions  . Atorvastatin     REACTION: myalgias  . Tribenzor [Olmesartan-Amlodipine-Hctz] Other (See Comments)    Joint pain    Current Outpatient Prescriptions on File Prior to Visit  Medication Sig Dispense Refill  . amLODipine (NORVASC) 5 MG tablet Take 1 tablet (5 mg total) by mouth daily.  30 tablet  11  . aspirin 81 MG tablet Take 81 mg by mouth daily.        . carvedilol (COREG) 12.5 MG tablet TAKE 1 TABLET BY MOUTH TWICE A DAY WITH A MEAL  180 tablet  0  . fish oil-omega-3  fatty acids 1000 MG capsule Take 2 g by mouth daily.        . folic acid-pyridoxine-cyancobalamin (FOLBIC) 2.5-25-2 MG TABS TAKE 1 TABLET BY MOUTH DAILY. INSURANCE ALLOWS 30 DAYS AT A TIME.  30 tablet  3  . hydrochlorothiazide (HYDRODIURIL) 25 MG tablet Take 25 mg by mouth daily.      . ramipril (ALTACE) 10 MG capsule TAKE ONE CAPSULE BY MOUTH TWICE A DAY *INS WOULD NOT PAY FOR A 90 DAY SUPPLY*  60 capsule  3  . rosuvastatin (CRESTOR) 5 MG tablet Take one tablet by mouth on Monday, Wednesday and Friday  30 tablet  11  . vitamin C (ASCORBIC ACID) 500 MG tablet Take 500 mg by mouth daily.         No current facility-administered medications on file prior to  visit.    BP 118/94  Pulse 62  Temp(Src) 98.4 F (36.9 C) (Oral)  Resp 12  Ht 5\' 5"  (1.651 m)  Wt 180 lb (81.647 kg)  BMI 29.95 kg/m2  SpO2 98%       Objective:   Physical Exam  Constitutional: He is oriented to person, place, and time. He appears well-nourished.  HENT:  Head: Normocephalic.  Cardiovascular: Normal rate and regular rhythm.   Pulmonary/Chest: Effort normal and breath sounds normal. No respiratory distress. He has no wheezes.  Musculoskeletal: He exhibits no edema.  Neurological: He is alert and oriented to person, place, and time.  Skin: Skin is warm and dry.  Psychiatric: He has a normal mood and affect.          Assessment & Plan:  I have personally seen and examined patient and agree with Alma Friendly NP student's assessment and plan.

## 2014-01-30 ENCOUNTER — Telehealth: Payer: Self-pay | Admitting: Family

## 2014-01-30 NOTE — Telephone Encounter (Signed)
Relevant patient education mailed to patient.  

## 2014-01-31 ENCOUNTER — Encounter: Payer: Self-pay | Admitting: Family

## 2014-04-07 ENCOUNTER — Other Ambulatory Visit: Payer: Self-pay | Admitting: Family

## 2014-05-02 ENCOUNTER — Other Ambulatory Visit: Payer: Self-pay | Admitting: Family

## 2014-05-28 ENCOUNTER — Encounter: Payer: Self-pay | Admitting: Family

## 2014-05-28 ENCOUNTER — Ambulatory Visit (INDEPENDENT_AMBULATORY_CARE_PROVIDER_SITE_OTHER): Payer: 59 | Admitting: Family

## 2014-05-28 ENCOUNTER — Telehealth: Payer: Self-pay | Admitting: Family

## 2014-05-28 VITALS — BP 146/92 | HR 55 | Temp 98.2°F | Resp 18 | Ht 65.0 in | Wt 175.0 lb

## 2014-05-28 DIAGNOSIS — I10 Essential (primary) hypertension: Secondary | ICD-10-CM

## 2014-05-28 MED ORDER — AMLODIPINE BESYLATE 10 MG PO TABS: 10.0000 mg | ORAL_TABLET | Freq: Every day | ORAL | Status: DC

## 2014-05-28 NOTE — Assessment & Plan Note (Signed)
Uncontrolled.  Increase amlodipine from 5mg  to 10 mg. Obtain BMET, follow up in 1 month for BP recheck.

## 2014-05-28 NOTE — Progress Notes (Signed)
Pre visit review using our clinic review tool, if applicable. No additional management support is needed unless otherwise documented below in the visit note. 

## 2014-05-28 NOTE — Patient Instructions (Signed)
Please complete lab work prior to leaving. Increase amlodipine form 5mg  to 10mg .  Follow up in 1 month for blood pressure recheck.

## 2014-05-28 NOTE — Progress Notes (Addendum)
Subjective:    Patient ID: [name], male    DOB: 07-Mar-1956, 58 y.o.   MRN: [id number]  HPI Here for FU for HTN and myalgias due to statin. States he is feeling well.  BP Readings from Last 3 Encounters:  05/28/14 146/92  01/29/14 118/94  11/15/13 130/100    Review of Systems Diet/ nutrition: avoids salt and fat in diet Exercise program: walks stairs at work frequently  HYPERTENSION: Disease Monitoring: not monitoring lately at home Medication Compliance: not taking hctz  HYPERLIPIDEMIA: Disease Monitoring:myalgias come and go Medication Compliance: takes crestor 1-2  times per week based on muscle pain  Chest pain, palpitations: no       Dyspnea: no Edema: no Claudication: no Lightheadedness,Syncope: no Weight gain/loss: no Polyuria/phagia/dipsia: no Blurred vision /diplopia/lossof vision: no Limb numbness/tingling/burning: no  Abd pain, bowel changes: no problem  Memory loss: no problem   Significant headaches, epistaxis, chest pain, palpitations, exertional dyspnea, claudication, paroxysmal nocturnal dyspnea, or edema absent.  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   Smoking status: Former Smoker    Start date: 01/29/1974   Smokeless tobacco: Former Systems developer    Quit date: 01/30/1984   Alcohol Use: No   Drug Use: No   Sexual Activity: Not on file   Other Topics Concern   Not on file   Social History Narrative   Married 20 yrs to his second wife   3 children 23, 63, 77    Past Surgical History  Procedure Laterality Date   Eye muscle surgery      Family History  Problem Relation Age of Onset   Coronary artery  disease Mother    Heart attack Mother     early 59's   Diabetes Mother    Hypertension Mother    Cancer Father     lung     Allergies  Allergen Reactions   Atorvastatin     REACTION: myalgias   Tribenzor [Olmesartan-Amlodipine-Hctz] Other (See Comments)    Joint pain    Current Outpatient Prescriptions on File Prior to Visit  Medication Sig Dispense Refill   aspirin 81 MG tablet Take 81 mg by mouth daily.         carvedilol (COREG) 12.5 MG tablet TAKE 1 TABLET BY MOUTH TWICE A DAY WITH A MEAL  180 tablet  0   fish oil-omega-3 fatty acids 1000 MG capsule Take 2 g by mouth daily.         FOLBIC 2.5-25-2 MG TABS TAKE 1 TABLET BY MOUTH DAILY. INSURANCE ALLOWS 30 DAYS AT A TIME.  30 tablet  3   ramipril (ALTACE) 10 MG capsule TAKE ONE CAPSULE BY MOUTH TWICE A DAY *INS WOULD NOT PAY FOR A 90 DAY SUPPLY*  60 capsule  3   rosuvastatin (CRESTOR) 5 MG tablet Take one tablet by mouth on Monday, Wednesday and Friday  30 tablet  11   vitamin C (ASCORBIC ACID) 500 MG tablet Take 500 mg by mouth daily.         No current  facility-administered medications on file prior to visit.    BP 146/92   Pulse 55   Temp(Src) 98.2 F (36.8 C) (Oral)   Resp 18   Ht 5\' 5"  (1.651 m)   Wt 175 lb 0.6 oz (79.398 kg)   BMI 29.13 kg/m2   SpO2 99%        Objective:   Physical Exam  Constitutional: He is oriented to person, place, and time. He appears well-developed and well-nourished. No distress.  HENT:  Head: Normocephalic and atraumatic.  Eyes: No scleral icterus.  Neck: Neck supple.  Cardiovascular: Normal rate, regular rhythm, normal heart sounds and intact distal pulses.   No murmur heard. Pulmonary/Chest: Effort normal and breath sounds normal. No respiratory distress.  Lymphadenopathy:    He has no cervical adenopathy.  Neurological: He is alert and oriented to person, place, and time.  Skin: Skin is warm and dry. He is not diaphoretic.  Psychiatric: He has a normal mood and  affect. His behavior is normal. Thought content normal.          Assessment & Plan:  Patient seen by [facility] NP-student.  I have personally seen and examined patient and agree with Ms. Whitmire's assessment and plan.

## 2014-05-28 NOTE — Telephone Encounter (Signed)
Relevant patient education mailed to patient.  

## 2014-05-29 ENCOUNTER — Encounter: Payer: Self-pay | Admitting: Family

## 2014-05-29 LAB — BASIC METABOLIC PANEL WITH GFR
BUN: 18 mg/dL (ref 6–23)
CO2: 27 meq/L (ref 19–32)
Calcium: 9.8 mg/dL (ref 8.4–10.5)
Chloride: 106 meq/L (ref 96–112)
Creat: 0.97 mg/dL (ref 0.50–1.35)
Glucose, Bld: 111 mg/dL — ABNORMAL HIGH (ref 70–99)
Potassium: 4.7 meq/L (ref 3.5–5.3)
Sodium: 142 meq/L (ref 135–145)

## 2014-07-10 ENCOUNTER — Ambulatory Visit: Payer: 59 | Admitting: Family

## 2014-07-16 ENCOUNTER — Other Ambulatory Visit: Payer: Self-pay | Admitting: Family

## 2014-07-23 ENCOUNTER — Ambulatory Visit: Payer: 59 | Admitting: Family

## 2014-08-04 ENCOUNTER — Other Ambulatory Visit: Payer: Self-pay | Admitting: Family

## 2014-08-04 MED ORDER — RAMIPRIL 10 MG PO CAPS: 10.0000 mg | ORAL_CAPSULE | Freq: Two times a day (BID) | ORAL | Status: DC

## 2014-08-06 ENCOUNTER — Other Ambulatory Visit: Payer: Self-pay | Admitting: Family

## 2014-08-24 ENCOUNTER — Ambulatory Visit: Payer: 59 | Admitting: Family

## 2014-09-05 ENCOUNTER — Other Ambulatory Visit: Payer: Self-pay | Admitting: Family

## 2014-09-28 ENCOUNTER — Ambulatory Visit: Payer: 59 | Admitting: Family

## 2014-09-28 ENCOUNTER — Ambulatory Visit: Payer: 59 | Admitting: Family Medicine

## 2014-10-01 ENCOUNTER — Encounter: Payer: Self-pay | Admitting: Family

## 2014-10-01 ENCOUNTER — Ambulatory Visit (INDEPENDENT_AMBULATORY_CARE_PROVIDER_SITE_OTHER): Payer: 59 | Admitting: Family

## 2014-10-01 VITALS — BP 176/93 | HR 57 | Temp 97.5°F | Resp 16 | Ht 65.0 in | Wt 175.2 lb

## 2014-10-01 DIAGNOSIS — Z23 Encounter for immunization: Secondary | ICD-10-CM

## 2014-10-01 DIAGNOSIS — R7309 Other abnormal glucose: Secondary | ICD-10-CM

## 2014-10-01 DIAGNOSIS — R7303 Prediabetes: Secondary | ICD-10-CM

## 2014-10-01 DIAGNOSIS — E785 Hyperlipidemia, unspecified: Secondary | ICD-10-CM

## 2014-10-01 DIAGNOSIS — I10 Essential (primary) hypertension: Secondary | ICD-10-CM

## 2014-10-01 MED ORDER — OLMESARTAN MEDOXOMIL-HCTZ 20-12.5 MG PO TABS: 1.0000 | ORAL_TABLET | Freq: Every day | ORAL | Status: DC

## 2014-10-01 NOTE — Patient Instructions (Addendum)
Stop amlodipine and ramipril. Start benicar hct Follow up in 2 weeks.

## 2014-10-01 NOTE — Progress Notes (Signed)
Subjective:    Patient ID: [name], male    DOB: 1955/12/29, 58 y.o.   MRN: [id number]  HPI  [name] is a 58 year old male who presents today for follow up.  1) HTN-  Reports that he was having trouble walking at the end of the day when he was on amlodipine. He reports that he is only taking 5 mg of amlodipine- seems to be tolerating better but still having some foot pain.   He is maintained on altace and carvedilol.    BP Readings from Last 3 Encounters:  10/01/14 176/93  05/28/14 146/92  01/29/14 118/94   2) Hyperlipidemia-  He is maintianed on crestor.  Lab Results  Component Value Date   CHOL 162 01/29/2014   HDL 37* 01/29/2014   LDLCALC 92 01/29/2014   TRIG 165* 01/29/2014   CHOLHDL 4.4 01/29/2014   3) hyperglycemia  Lab Results  Component Value Date   HGBA1C 5.6 09/29/2013   HGBA1C 5.5 01/20/2013   HGBA1C 5.6 07/22/2012   Lab Results  Component Value Date   MICROALBUR 1.37 01/20/2013   LDLCALC 92 01/29/2014   CREATININE 0.97 05/28/2014     Review of Systems See HPI  Past Medical History  Diagnosis Date  . CAD (coronary artery disease)     post stent X 3 to LAD  . Hypertension   . Dyslipidemia     History   Social History  . Marital Status: Married    Spouse Name: N/A    Number of Children: N/A  . 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 Smoking status: Former Smoker    Start date: 01/29/1974  . Smokeless tobacco: Former Systems developer    Quit date: 01/30/1984  . Alcohol Use: No  . Drug Use: No  . Sexual Activity: Not on file   Other Topics Concern  . Not on file   Social History Narrative   Married 20 yrs to his second wife   3 children 77, 16, 20    Past Surgical History  Procedure Laterality Date  . Eye muscle surgery      Family History  Problem Relation Age of Onset  . Coronary artery disease Mother   . Heart attack Mother     early 92's  . Diabetes Mother    . Hypertension Mother   . Cancer Father     lung     Allergies  Allergen Reactions  . Atorvastatin     REACTION: myalgias  . Tribenzor [Olmesartan-Amlodipine-Hctz] Other (See Comments)    Joint pain    Current Outpatient Prescriptions on File Prior to Visit  Medication Sig Dispense Refill  . amLODipine (NORVASC) 10 MG tablet TAKE 1 TABLET (10 MG TOTAL) BY MOUTH DAILY.  30 tablet  3  . aspirin 81 MG tablet Take 81 mg by mouth daily.        . carvedilol (COREG) 12.5 MG tablet TAKE 1 TABLET BY MOUTH TWICE A DAY WITH A MEAL  180 tablet  0  . fish oil-omega-3 fatty acids 1000 MG capsule Take 2 g by mouth daily.        . FOLBIC 2.5-25-2 MG TABS TAKE 1 TABLET BY MOUTH EVERY DAY  30 tablet  3  . ramipril (ALTACE) 10 MG capsule Take 1 capsule (10 mg total) by mouth 2 (two) times daily.  60 capsule  3  . rosuvastatin (CRESTOR) 5 MG tablet Take one tablet by mouth on Monday, Wednesday and Friday  30 tablet  11  . vitamin C (ASCORBIC ACID) 500 MG tablet Take 500 mg by mouth daily.         No current facility-administered medications on file prior to visit.    BP 176/93  Pulse 57  Temp(Src) 97.5 F (36.4 C) (Oral)  Resp 16  Ht 5\' 5"  (1.651 m)  Wt 175 lb 3.2 oz (79.47 kg)  BMI 29.15 kg/m2  SpO2 100%       Objective:   Physical Exam  Constitutional: He is oriented to person, place, and time. He appears well-developed and well-nourished. No distress.  HENT:  Head: Normocephalic and atraumatic.  Cardiovascular: Normal rate and regular rhythm.   No murmur heard. Pulmonary/Chest: Effort normal and breath sounds normal. No respiratory distress. He has no wheezes. He has no rales. He exhibits no tenderness.  Musculoskeletal: He exhibits no edema.  Neurological: He is alert and oriented to person, place, and time.  Skin: Skin is warm and dry.  Psychiatric: He has a normal mood and affect. His behavior is normal. Judgment and thought content normal.          Assessment & Plan:

## 2014-10-01 NOTE — Assessment & Plan Note (Signed)
Uncontrolled.  D/c amlodipine, change ramipril to benicar hct.  Follow up in 2 weeks for BP check and labs.

## 2014-10-01 NOTE — Progress Notes (Signed)
Pre visit review using our clinic review tool, if applicable. No additional management support is needed unless otherwise documented below in the visit note. 

## 2014-10-01 NOTE — Assessment & Plan Note (Signed)
LDL at goal. Continue statin. Plan flp next visit

## 2014-10-01 NOTE — Assessment & Plan Note (Addendum)
Plan A1C next visit.  

## 2014-10-19 ENCOUNTER — Ambulatory Visit (INDEPENDENT_AMBULATORY_CARE_PROVIDER_SITE_OTHER): Payer: 59 | Admitting: Family

## 2014-10-19 ENCOUNTER — Encounter: Payer: Self-pay | Admitting: Family

## 2014-10-19 VITALS — BP 160/98 | HR 50 | Temp 98.6°F | Resp 16 | Ht 65.0 in | Wt 179.2 lb

## 2014-10-19 DIAGNOSIS — R739 Hyperglycemia, unspecified: Secondary | ICD-10-CM

## 2014-10-19 DIAGNOSIS — E785 Hyperlipidemia, unspecified: Secondary | ICD-10-CM

## 2014-10-19 DIAGNOSIS — I10 Essential (primary) hypertension: Secondary | ICD-10-CM

## 2014-10-19 LAB — LIPID PANEL
CHOLESTEROL: 148 mg/dL (ref 0–200)
HDL: 34.2 mg/dL — AB (ref >39.00)
LDL Cholesterol: 84 mg/dL (ref 0–99)
NONHDL: 113.8
Total CHOL/HDL Ratio: 4
Triglycerides: 148 mg/dL (ref 0.0–149.0)
VLDL: 29.6 mg/dL (ref 0.0–40.0)

## 2014-10-19 LAB — HEMOGLOBIN A1C: Hgb A1c MFr Bld: 5 % (ref 4.6–6.5)

## 2014-10-19 LAB — BASIC METABOLIC PANEL WITH GFR
BUN: 19 mg/dL (ref 6–23)
CO2: 26 meq/L (ref 19–32)
Calcium: 9.2 mg/dL (ref 8.4–10.5)
Chloride: 107 meq/L (ref 96–112)
Creatinine, Ser: 1 mg/dL (ref 0.4–1.5)
GFR: 84.99 mL/min
Glucose, Bld: 106 mg/dL — ABNORMAL HIGH (ref 70–99)
Potassium: 4.3 meq/L (ref 3.5–5.1)
Sodium: 140 meq/L (ref 135–145)

## 2014-10-19 LAB — HEPATIC FUNCTION PANEL
ALK PHOS: 57 U/L (ref 39–117)
ALT: 46 U/L (ref 0–53)
AST: 22 U/L (ref 0–37)
Albumin: 4 g/dL (ref 3.5–5.2)
Bilirubin, Direct: 0.1 mg/dL (ref 0.0–0.3)
Total Bilirubin: 0.9 mg/dL (ref 0.2–1.2)
Total Protein: 7.2 g/dL (ref 6.0–8.3)

## 2014-10-19 MED ORDER — OLMESARTAN MEDOXOMIL-HCTZ 40-25 MG PO TABS: 1.0000 | ORAL_TABLET | Freq: Every day | ORAL | Status: DC

## 2014-10-19 NOTE — Assessment & Plan Note (Signed)
Check A1C 

## 2014-10-19 NOTE — Progress Notes (Signed)
Subjective:    Patient ID: [name], male    DOB: September 15, 1956, 58 y.o.   MRN: [id number]  HPI  [name] is a 58 yr old male who presents for follow up.  1) HTN- last visit amlodipine was discontinued due to side effects.  Ramipril was changed to benicar hct.  Reports that at home yesterday it was 170/104. BP Readings from Last 3 Encounters:  10/19/14 160/98  10/01/14 176/93  05/28/14 146/92      Review of Systems See HPI  Past Medical History  Diagnosis Date  . CAD (coronary artery disease)     post stent X 3 to LAD  . Hypertension   . Dyslipidemia     History   Social History  . Marital Status: Married    Spouse Name: N/A    Number of Children: N/A  . Years of Education: N/A   Occupational History  . Dealer     for air [place] History Main Topics  . Smoking status: Former Smoker    Start date: 01/29/1974  . Smokeless tobacco: Former Systems developer    Quit date: 01/30/1984  . Alcohol Use: No  . Drug Use: No  . Sexual Activity: Not on file   Other Topics Concern  . Not on file   Social History Narrative   Married 20 yrs to his second wife   3 children 11, 49, 8    Past Surgical History  Procedure Laterality Date  . Eye muscle surgery      Family History  Problem Relation Age of Onset  . Coronary artery disease Mother   . Heart attack Mother     early 91's  . Diabetes Mother   . Hypertension Mother   . Cancer Father     lung     Allergies  Allergen Reactions  . Atorvastatin     REACTION: myalgias  . Tribenzor [Olmesartan-Amlodipine-Hctz] Other (See Comments)    Joint pain    Current Outpatient Prescriptions on File Prior to Visit  Medication Sig Dispense Refill  . aspirin 81 MG tablet Take 81 mg by mouth daily.        . carvedilol (COREG) 12.5 MG tablet TAKE 1 TABLET BY MOUTH TWICE A DAY WITH A MEAL  180 tablet  0  . fish oil-omega-3 fatty acids 1000 MG capsule Take 2 g by mouth daily.        . FOLBIC 2.5-25-2 MG  TABS TAKE 1 TABLET BY MOUTH EVERY DAY  30 tablet  3  . rosuvastatin (CRESTOR) 5 MG tablet Take one tablet by mouth on Monday, Wednesday and Friday  30 tablet  11  . vitamin C (ASCORBIC ACID) 500 MG tablet Take 500 mg by mouth daily.        . [DISCONTINUED] olmesartan-hydrochlorothiazide (BENICAR HCT) 20-12.5 MG per tablet Take 1 tablet by mouth daily.  30 tablet  0   No current facility-administered medications on file prior to visit.    BP 160/98  Pulse 50  Temp(Src) 98.6 F (37 C) (Oral)  Resp 16  Ht 5\' 5"  (1.651 m)  Wt 179 lb 3.2 oz (81.285 kg)  BMI 29.82 kg/m2  SpO2 99%       Objective:   Physical Exam  Constitutional: He is oriented to person, place, and time. He appears well-developed and well-nourished. No distress.  HENT:  Head: Normocephalic and atraumatic.  Cardiovascular: Normal rate and regular rhythm.   No murmur heard. Pulmonary/Chest:  Effort normal and breath sounds normal. No respiratory distress. He has no wheezes. He has no rales. He exhibits no tenderness.  Neurological: He is alert and oriented to person, place, and time.  Psychiatric: He has a normal mood and affect. His behavior is normal. Judgment and thought content normal.          Assessment & Plan:

## 2014-10-19 NOTE — Progress Notes (Signed)
Pre visit review using our clinic review tool, if applicable. No additional management support is needed unless otherwise documented below in the visit note. 

## 2014-10-19 NOTE — Assessment & Plan Note (Signed)
Uncontrolled. Increase benicar hct from 20-12.5 to 40-25. Follow up in 3 weeks. Check bmet today.

## 2014-10-19 NOTE — Patient Instructions (Signed)
Please complete your lab work prior to leaving. Increase benicar HCT dose to 40-25mg  Follow up in 3 weeks.

## 2014-11-09 ENCOUNTER — Ambulatory Visit: Payer: 59 | Admitting: Family

## 2014-11-18 ENCOUNTER — Other Ambulatory Visit: Payer: Self-pay | Admitting: Cardiovascular Disease

## 2014-11-22 ENCOUNTER — Other Ambulatory Visit: Payer: Self-pay | Admitting: Family

## 2014-11-26 NOTE — Telephone Encounter (Signed)
Pt last seen 10/19/14 and advised 3 week f/u.  Pt given 2 week supply of carvedilol and will need to be seen before further refills are given.

## 2014-11-26 NOTE — Telephone Encounter (Signed)
Left detailed message informing patient of medication refill and to schedule an appointment

## 2014-12-17 ENCOUNTER — Other Ambulatory Visit: Payer: Self-pay | Admitting: Family

## 2014-12-17 DIAGNOSIS — I10 Essential (primary) hypertension: Secondary | ICD-10-CM

## 2014-12-17 NOTE — Telephone Encounter (Signed)
Pt's meds were changed from amlodipine and ramipril to benicar hct at 10/19/14 visit.  Amlodipine was stopped previously due to side effects and ramipril was changed to benicar hct. Left detailed message requesting pt return my call to let us know why he is requesting old medications. We can send in 2 week supply of benicar hct but he needs to be seen in that time for f/u of BP and medication adjustment.

## 2014-12-18 NOTE — Telephone Encounter (Signed)
Left detailed message on cell# to call and schedule nurse visit and complete bloodwork tomorrow. Future lab order entered.

## 2014-12-18 NOTE — Telephone Encounter (Signed)
Please sched pt for nurse visit this week for bmet and bp check. Then i can send refill if appropriate. He was due for f/u in November.

## 2014-12-18 NOTE — Telephone Encounter (Signed)
Please see below message. Pt also stated that his side effect to amlodipine was foot pain. Please advise.

## 2014-12-18 NOTE — Telephone Encounter (Signed)
Pt called stating his BP ran up so high it scared him (doesn't remember the exact reading) while he was on benicar hct so he stopped it and went back on amlodipine and ramipril that he was taking before. Pt is requesting refill of amlodipine and ramipril as he states he will be seeing his cardiologist in January and will talk with them about managing his BP going forward. Thinks he has a week supply of each medication at this time.  Please advise.

## 2014-12-24 ENCOUNTER — Ambulatory Visit: Payer: 59 | Admitting: Medical

## 2014-12-24 ENCOUNTER — Telehealth: Payer: Self-pay | Admitting: Medical

## 2014-12-24 DIAGNOSIS — I10 Essential (primary) hypertension: Secondary | ICD-10-CM

## 2014-12-24 NOTE — Progress Notes (Signed)
   Subjective:    Patient ID: [name], male    DOB: [birth year], 58 y.o.   MRN: [id number]  HPI   Pt states he was told he needed labs. He was not under impression he needed to be seen and he refused to be seen. His bp was 174/99. His bp at home before he came here was lower her mentioned 150/90. He has no chest pain or neurologic signs or symptoms.   Gilmore Laroche or Lenna Sciara was not here to discuss this pt.  I did find some labs done in October(this was after I put in labs). So it has not been 3 months. So I advised against getting labs today in event they would not be covered. I asked him to call Gilmore Laroche later this week and clarify when lab is to be done and when follow up should be.        Review of Systems     Objective:   Physical Exam        Assessment & Plan:

## 2014-12-24 NOTE — Telephone Encounter (Signed)
Labs today only.

## 2014-12-27 ENCOUNTER — Other Ambulatory Visit: Payer: Self-pay | Admitting: Family

## 2014-12-27 NOTE — Telephone Encounter (Signed)
Med filled.  

## 2015-01-09 ENCOUNTER — Other Ambulatory Visit: Payer: Self-pay | Admitting: Family

## 2015-01-09 NOTE — Telephone Encounter (Signed)
[name]-- please advise re: folbic and carvedilol refill requests.  See previous phone note 12/17/14 phone note and 12/24/14 office note. Pt doesn't have f/u with cardiology until 01/23/15.

## 2015-01-09 NOTE — Telephone Encounter (Signed)
Refill sent. Left detailed message on pt's cell#.

## 2015-01-09 NOTE — Telephone Encounter (Signed)
Ok to give 14 day supply but needs to OV prior to additional refills.

## 2015-01-23 ENCOUNTER — Ambulatory Visit (INDEPENDENT_AMBULATORY_CARE_PROVIDER_SITE_OTHER): Payer: 59 | Admitting: Cardiovascular Disease

## 2015-01-23 ENCOUNTER — Encounter: Payer: Self-pay | Admitting: Cardiovascular Disease

## 2015-01-23 VITALS — BP 152/98 | HR 62 | Ht 65.0 in | Wt 177.0 lb

## 2015-01-23 DIAGNOSIS — I10 Essential (primary) hypertension: Secondary | ICD-10-CM

## 2015-01-23 DIAGNOSIS — I2510 Atherosclerotic heart disease of native coronary artery without angina pectoris: Secondary | ICD-10-CM

## 2015-01-23 DIAGNOSIS — E785 Hyperlipidemia, unspecified: Secondary | ICD-10-CM

## 2015-01-23 MED ORDER — CARVEDILOL 25 MG PO TABS
25.0000 mg | ORAL_TABLET | Freq: Two times a day (BID) | ORAL | Status: DC
Start: 2015-01-23 — End: 2015-03-15

## 2015-01-23 MED ORDER — AMLODIPINE BESYLATE 5 MG PO TABS: 5.0000 mg | ORAL_TABLET | Freq: Every day | ORAL | Status: DC

## 2015-01-23 NOTE — Patient Instructions (Signed)
Your physician has recommended you make the following change in your medication:  1. INCREASE Carvedilol to 25mg  take one by mouth twice a day  Your physician wants you to follow-up in: 1 YEAR with Dr Burt Knack.  You will receive a reminder letter in the mail two months in advance. If you don't receive a letter, please call our office to schedule the follow-up appointment.

## 2015-01-23 NOTE — Progress Notes (Signed)
Cardiology Office Note   Date:  01/23/2015   ID:  Shamus, Desantis 08-23-1956, MRN [id number]  PCP:  Nance Pear., NP  Cardiologist:  Sherren Mocha, MD    Chief Complaint  Patient presents with  . Coronary Artery Disease     History of Present Illness: [name] is a 59 y.o. male who presents for follow-up evaluation. The patient has been followed for coronary artery disease and underwent PCI of the LAD in 2002 when he presented with exertional angina. At that time he presented with chest pain and had an abnormal stress test demonstrating anterior ischemia. He was found to have severe LAD stenosis and was ultimately treated with 3 bare-metal stents in the mid and distal LAD. He's been unable to tolerate statin drugs and has failed 3 different agents secondary to myalgias. He also has a long history whitecoat hypertension.  He remains physically active. 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 He has not had any cardiac complaints and specifically denies chest pain, shortness of breath, edema, orthopnea, or PND. No palpitations.   He made some changes in his antihypertensive medications and had some difficulty with elevated blood pressure readings. His blood pressure is now back in a range of 140s to 150s. He has never had a systolic blood pressure less than 140 consistently over the past 10-15 years. He was unable to tolerate amlodipine at high dose because of foot pain and swelling. He has only been able to tolerate Crestor one day per week. He continues with this.    Past Medical History  Diagnosis Date  . CAD (coronary artery disease)     post stent X 3 to LAD  . Hypertension   . Dyslipidemia     Past Surgical History  Procedure Laterality Date  . Eye muscle surgery      Current Outpatient Prescriptions  Medication Sig Dispense Refill  . amLODipine (NORVASC) 10 MG tablet Take 5 mg by mouth daily.  3  . aspirin 81 MG tablet Take 81  mg by mouth daily.      . carvedilol (COREG) 12.5 MG tablet TAKE 1 TABLET BY MOUTH TWICE A DAY WITH A MEAL 28 tablet 0  . CRESTOR 5 MG tablet TAKE ONE TABLET BY MOUTH ON MONDAY, WEDNESDAY AND FRIDAY 30 tablet 0  . fish oil-omega-3 fatty acids 1000 MG capsule Take 2 g by mouth daily.      . FOLBIC 2.5-25-2 MG TABS TAKE 1 TABLET BY MOUTH EVERY DAY 30 tablet 3  . olmesartan-hydrochlorothiazide (BENICAR HCT) 40-25 MG per tablet Take 1 tablet by mouth daily. 30 tablet 0  . ramipril (ALTACE) 10 MG capsule TAKE 1 CAPSULE BY MOUTH TWICE A DAY 60 capsule 3  . vitamin C (ASCORBIC ACID) 500 MG tablet Take 500 mg by mouth daily.       No current facility-administered medications for this visit.    Allergies:   Atorvastatin and Tribenzor   Social History:  The patient  reports that he has quit smoking. He started smoking about 41 years ago. He quit smokeless tobacco use about 31 years ago. He reports that he does not drink alcohol or use illicit drugs.   Family History:  The patient's family history includes Cancer in his father; Coronary artery disease in his mother; Diabetes in his mother; Heart attack in his mother; Hypertension in his mother.    ROS:  Please see the history of present  illness.  All other systems are reviewed and negative.    PHYSICAL EXAM: VS:  BP 152/98 mmHg  Pulse 62  Ht 5\' 5"  (1.651 m)  Wt 177 lb (80.287 kg)  BMI 29.45 kg/m2 , BMI Body mass index is 29.45 kg/(m^2). GEN: Well nourished, well developed, in no acute distress HEENT: normal Neck: no JVD, carotid bruits, or masses Cardiac: RRR; no murmurs, rubs, or gallops,no edema  Respiratory:  clear to auscultation bilaterally, normal work of breathing GI: soft, nontender, nondistended, + BS MS: no deformity or atrophy Skin: warm and dry, no rash Neuro:  Strength and sensation are intact Psych: euthymic mood, full affect  EKG:  EKG is ordered today. The ekg ordered today shows normal sinus rhythm 62 bpm, within normal  limits.  Recent Labs: 10/19/2014: ALT 46; BUN 19; Creatinine 1.0; Potassium 4.3; Sodium 140   Lipid Panel     Component Value Date/Time   CHOL 148 10/19/2014 0818   TRIG 148.0 10/19/2014 0818   TRIG 79 01/03/2007 0737   HDL 34.20* 10/19/2014 0818   CHOLHDL 4 10/19/2014 0818   VLDL 29.6 10/19/2014 0818   LDLCALC 84 10/19/2014 0818      Wt Readings from Last 3 Encounters:  01/23/15 177 lb (80.287 kg)  10/19/14 179 lb 3.2 oz (81.285 kg)  10/01/14 175 lb 3.2 oz (79.47 kg)     ASSESSMENT AND PLAN: 1.  Coronary artery disease, native vessel, without symptoms of angina. The patient is doing well on his current medical program. He is on appropriate risk reduction program which was reviewed today. I will see him back in 12 months.  2. Essential hypertension, uncontrolled. Reviewed his medications in detail today. Recommended increase carvedilol to 25 mg twice daily. Otherwise continue his current medications.  3. Hyperlipidemia. He is tolerating Crestor only one day per week. Last lipids reviewed. Continue same.  Current medicines are reviewed with the patient today.  The patient does not have concerns regarding medicines.  The following changes have been made:  See above  No orders of the defined types were placed in this encounter.    Disposition:   FU with me in 12 months  Signed, Sherren Mocha, MD  01/23/2015 10:29 AM    [facility] [place], [place], [place]  [address] Phone: [phone]; Fax: [phone]

## 2015-03-11 ENCOUNTER — Telehealth: Payer: Self-pay | Admitting: *Deleted

## 2015-03-11 DIAGNOSIS — I10 Essential (primary) hypertension: Secondary | ICD-10-CM

## 2015-03-11 DIAGNOSIS — I2510 Atherosclerotic heart disease of native coronary artery without angina pectoris: Secondary | ICD-10-CM

## 2015-03-11 DIAGNOSIS — E785 Hyperlipidemia, unspecified: Secondary | ICD-10-CM

## 2015-03-11 NOTE — Telephone Encounter (Signed)
Patient called and stated that since the carvedilol was increased, he has been experiencing some joint pain. In his knees, it is to the extent that sometimes he has difficulty walking. Please advise. Thanks, MI

## 2015-03-14 NOTE — Telephone Encounter (Signed)
I don't know of any association between carvedilol and joint pains. I would try to stick with this for a little while longer since his blood pressure was significantly elevated and he is on a maximum dose of multiple other antihypertensive medicines. If continued problems could go back to previous carvedilol dose and try something different.

## 2015-03-15 MED ORDER — CARVEDILOL 12.5 MG PO TABS: 12.5000 mg | ORAL_TABLET | Freq: Two times a day (BID) | ORAL | Status: DC

## 2015-03-15 NOTE — Telephone Encounter (Signed)
I spoke with the pt and made him aware of Dr Antionette Char recommendation.  The pt states that he has already decreased Carvedilol to 12.5mg  twice a day.  The pt is monitoring his BP at home.  I advised the pt to continue monitoring his BP and contact our office in 1-2 weeks with readings.  If BP is consistently above 140/90 we will make further medication changes.  Pt agreed with plan.

## 2015-04-11 ENCOUNTER — Other Ambulatory Visit: Payer: Self-pay | Admitting: Family

## 2015-04-15 ENCOUNTER — Other Ambulatory Visit: Payer: Self-pay | Admitting: Family

## 2015-05-13 ENCOUNTER — Other Ambulatory Visit: Payer: Self-pay | Admitting: Cardiovascular Disease

## 2015-05-18 ENCOUNTER — Other Ambulatory Visit: Payer: Self-pay | Admitting: Family

## 2015-08-11 ENCOUNTER — Other Ambulatory Visit: Payer: Self-pay | Admitting: Family

## 2015-08-12 NOTE — Telephone Encounter (Signed)
30 day supply of ramipril sent to pharmacy. Pt last seen in our office 11/2014 and is due for follow up. Further refills cannot be given until pt is seen in the office.  Please call pt to make appt with Debbrah Alar, NP.

## 2015-08-12 NOTE — Telephone Encounter (Signed)
Left msg for pt to call and schedule f/u appt  °

## 2015-09-07 ENCOUNTER — Other Ambulatory Visit: Payer: Self-pay | Admitting: Family

## 2015-09-09 NOTE — Telephone Encounter (Signed)
2 week supply sent to pharmacy. Pt is past due for follow up.  Please call pt to schedule appt with Debbrah Alar, NP.

## 2015-09-10 ENCOUNTER — Other Ambulatory Visit: Payer: Self-pay | Admitting: Cardiovascular Disease

## 2015-09-10 NOTE — Telephone Encounter (Signed)
LVM advising patient of message below °

## 2015-09-10 NOTE — Telephone Encounter (Signed)
Mailed letter to pt

## 2015-09-18 ENCOUNTER — Other Ambulatory Visit: Payer: Self-pay | Admitting: Family

## 2015-09-18 NOTE — Telephone Encounter (Signed)
Ramipril request denied. Previous messages have been left and a letter has been mailed to pt notifying him of need for follow up on 09/10/15.

## 2015-09-19 ENCOUNTER — Other Ambulatory Visit: Payer: Self-pay | Admitting: Cardiovascular Disease

## 2015-10-02 ENCOUNTER — Other Ambulatory Visit: Payer: Self-pay | Admitting: Cardiovascular Disease

## 2015-10-02 NOTE — Telephone Encounter (Signed)
Previously refilled by Debbrah Alar. Ok to refill or defer back to her office? Please advise. Thanks, MI

## 2015-10-03 ENCOUNTER — Other Ambulatory Visit: Payer: Self-pay | Admitting: Cardiovascular Disease

## 2015-10-03 NOTE — Telephone Encounter (Signed)
Please ask primary care to refill

## 2016-01-10 ENCOUNTER — Other Ambulatory Visit: Payer: Self-pay | Admitting: Cardiovascular Disease

## 2016-01-21 ENCOUNTER — Other Ambulatory Visit: Payer: Self-pay | Admitting: Cardiovascular Disease

## 2016-01-30 ENCOUNTER — Other Ambulatory Visit: Payer: Self-pay | Admitting: Cardiovascular Disease

## 2016-02-03 ENCOUNTER — Other Ambulatory Visit: Payer: Self-pay | Admitting: Cardiovascular Disease

## 2016-02-06 ENCOUNTER — Other Ambulatory Visit: Payer: Self-pay | Admitting: Cardiovascular Disease

## 2016-03-16 ENCOUNTER — Other Ambulatory Visit: Payer: Self-pay | Admitting: Cardiovascular Disease

## 2016-03-30 ENCOUNTER — Encounter: Payer: Self-pay | Admitting: Cardiovascular Disease

## 2016-03-30 ENCOUNTER — Ambulatory Visit (INDEPENDENT_AMBULATORY_CARE_PROVIDER_SITE_OTHER): Payer: Managed Care, Other (non HMO) | Admitting: Cardiovascular Disease

## 2016-03-30 VITALS — BP 190/108 | HR 72 | Ht 65.0 in | Wt 180.4 lb

## 2016-03-30 DIAGNOSIS — E785 Hyperlipidemia, unspecified: Secondary | ICD-10-CM | POA: Diagnosis not present

## 2016-03-30 DIAGNOSIS — I2510 Atherosclerotic heart disease of native coronary artery without angina pectoris: Secondary | ICD-10-CM | POA: Diagnosis not present

## 2016-03-30 DIAGNOSIS — I10 Essential (primary) hypertension: Secondary | ICD-10-CM

## 2016-03-30 MED ORDER — CARVEDILOL 6.25 MG PO TABS: 6.2500 mg | ORAL_TABLET | Freq: Two times a day (BID) | ORAL | Status: DC

## 2016-03-30 MED ORDER — CHLORTHALIDONE 25 MG PO TABS: 25.0000 mg | ORAL_TABLET | Freq: Every day | ORAL | Status: DC

## 2016-03-30 MED ORDER — AMLODIPINE BESYLATE 5 MG PO TABS: ORAL_TABLET | ORAL | Status: DC

## 2016-03-30 NOTE — Progress Notes (Signed)
Cardiology Office Note Date:  03/30/2016   ID:  JAZZ [name], DOB [birth year], MRN [id number]  PCP:  Nance Pear., NP  Cardiologist:  Sherren Mocha, MD    Chief Complaint  Patient presents with  . Coronary Artery Disease    denies any cp, sob, le edema, or claudication     History of Present Illness: [name] is a 60 y.o. male who presents for follow-up evaluation. The patient has been followed for coronary artery disease and underwent PCI of the LAD in 2002 when he presented with exertional angina. At that time he presented with chest pain and had an abnormal stress test demonstrating anterior ischemia. He was found to have severe LAD stenosis and was ultimately treated with 3 bare-metal stents in the mid and distal LAD. He's been unable to tolerate statin drugs and has failed 3 different agents secondary to myalgias. He also has a long history whitecoat hypertension.  He's been having a lot of problems with BP control. He has multiple intolerances And he is taking his statin drug only once per week. He has been on and off of his blood pressure medicines. He feels like carvedilol is causing his joints to swell. He couldn't tolerate amlodipine at higher than 5 mg dose because of swelling. He's had trouble getting some of his medicines refilled. He denies chest pain, chest pressure, lightheadedness, headache, vision changes, or syncope. He does admit to shortness of breath with climbing 3-4 flights of stairs. Overall he says he feels pretty well except he's been worried about his blood pressure.   Past Medical History  Diagnosis Date  . CAD (coronary artery disease)     post stent X 3 to LAD  . Hypertension   . Dyslipidemia     Past Surgical History  Procedure Laterality Date  . Eye muscle surgery      Current Outpatient Prescriptions  Medication Sig Dispense Refill  . amLODipine (NORVASC) 5 MG tablet TAKE 1 TABLET (5 MG TOTAL) BY MOUTH DAILY. 30 tablet 0  .  aspirin 81 MG tablet Take 81 mg by mouth daily.      . carvedilol (COREG) 12.5 MG tablet Take 1 tablet (12.5 mg total) by mouth 2 (two) times daily with a meal.    . CRESTOR 5 MG tablet TAKE ONE TABLET BY MOUTH ON MONDAY, WEDNESDAY AND FRIDAY 30 tablet 5  . fish oil-omega-3 fatty acids 1000 MG capsule Take 2 g by mouth daily.      . FOLBIC 2.5-25-2 MG TABS tablet TAKE 1 TABLET BY MOUTH EVERY DAY 30 tablet 3  . ramipril (ALTACE) 10 MG capsule TAKE 1 CAPSULE BY MOUTH TWICE A DAY 60 capsule 0  . vitamin C (ASCORBIC ACID) 500 MG tablet Take 500 mg by mouth daily.       No current facility-administered medications for this visit.    Allergies:   Atorvastatin and Tribenzor   Social History:  The patient  reports that he has quit smoking. He started smoking about 42 years ago. He quit smokeless tobacco use about 32 years ago. He reports that he does not drink alcohol or use illicit drugs.   Family History:  The patient's family history includes Cancer in his father; Coronary artery disease in his mother; Diabetes in his mother; Heart attack in his mother; Hypertension in his mother.    ROS:  Please see the history of present illness.  Otherwise, review of systems is positive for .  All other  systems are reviewed and negative.    PHYSICAL EXAM: VS:  BP 190/108 mmHg  Pulse 72  Ht 5\' 5"  (1.651 m)  Wt 180 lb 6.4 oz (81.829 kg)  BMI 30.02 kg/m2 , BMI Body mass index is 30.02 kg/(m^2).  BP on my recheck is 180/105 mmHg GEN: Well nourished, well developed, in no acute distress HEENT: normal Neck: no JVD, no masses. No carotid bruits Cardiac: RRR without murmur or gallop                Respiratory:  clear to auscultation bilaterally, normal work of breathing GI: soft, nontender, nondistended, + BS MS: no deformity or atrophy Ext: no pretibial edema, pedal pulses 2+= bilaterally Skin: warm and dry, no rash Neuro:  Strength and sensation are intact Psych: euthymic mood, full affect  EKG:  EKG  is ordered today. The ekg ordered today shows NSR 72 bpm, nonspecific ST abnormality  Recent Labs: No results found for requested labs within last 365 days.   Lipid Panel     Component Value Date/Time   CHOL 148 10/19/2014 0818   TRIG 148.0 10/19/2014 0818   TRIG 79 01/03/2007 0737   HDL 34.20* 10/19/2014 0818   CHOLHDL 4 10/19/2014 0818   CHOLHDL 3.5 CALC 01/03/2007 0737   VLDL 29.6 10/19/2014 0818   LDLCALC 84 10/19/2014 0818      Wt Readings from Last 3 Encounters:  03/30/16 180 lb 6.4 oz (81.829 kg)  01/23/15 177 lb (80.287 kg)  10/19/14 179 lb 3.2 oz (81.285 kg)    ASSESSMENT AND PLAN: 1.  Essential HTN, uncontrolled:We spent a long time reviewing the need for medication adherence and improvement of blood pressure control. The patient is avoiding salt. Made the following recommendations with his medications: Continue amlodipine 5 mg daily Change carvedilol to 6.25 mg twice daily (currently taking once daily) Continue ramipril 10 mg twice daily Start chlorthalidone 25 mg daily Follow-up one month with PA/NP and lab work. Continue to monitor blood pressure at home.  2. CAD, native vessel, without angina: Overall appears stable. Medication changes as above  3. Hyperlipidemia: statin dose very limited because of myalgias. Taking Crestor once/week.  Current medicines are reviewed with the patient today.  The patient does not have concerns regarding medicines.  Labs/ tests ordered today include:  No orders of the defined types were placed in this encounter.    Disposition:   FU one year, will arrange PA/NP visit in 4 weeks.  Deatra James, MD  03/30/2016 10:22 AM    [facility] [place], [place], [place]  [address] Phone: [phone]; Fax: [phone]

## 2016-03-30 NOTE — Patient Instructions (Addendum)
Medication Instructions:  Your physician has recommended you make the following change in your medication:  1. INCREASE Carvedilol to 6.25mg  take one tablet by mouth twice a day 2. START Chlorthalidone 25mg  take one tablet by mouth daily 3. RESTART Amlodipine  Labwork: Your physician recommends that you return for a FASTING LIPID and CMP in 1 MONTH--nothing to eat or drink after midnight, lab opens at 7:30 AM  Testing/Procedures: No new orders.   Follow-Up: Your physician recommends that you schedule a follow-up appointment in: 1 MONTH with PA/NP  Your physician wants you to follow-up in: 1 YEAR with Dr Burt Knack.  You will receive a reminder letter in the mail two months in advance. If you don't receive a letter, please call our office to schedule the follow-up appointment.    Any Other Special Instructions Will Be Listed Below (If Applicable).     If you need a refill on your cardiac medications before your next appointment, please call your pharmacy.

## 2016-04-15 ENCOUNTER — Other Ambulatory Visit: Payer: Self-pay | Admitting: Cardiovascular Disease

## 2016-04-27 ENCOUNTER — Other Ambulatory Visit (INDEPENDENT_AMBULATORY_CARE_PROVIDER_SITE_OTHER): Payer: Managed Care, Other (non HMO) | Admitting: *Deleted

## 2016-04-27 DIAGNOSIS — I10 Essential (primary) hypertension: Secondary | ICD-10-CM

## 2016-04-27 DIAGNOSIS — I2510 Atherosclerotic heart disease of native coronary artery without angina pectoris: Secondary | ICD-10-CM | POA: Diagnosis not present

## 2016-04-27 DIAGNOSIS — E785 Hyperlipidemia, unspecified: Secondary | ICD-10-CM

## 2016-04-27 LAB — COMPREHENSIVE METABOLIC PANEL
ALBUMIN: 4.6 g/dL (ref 3.6–5.1)
ALT: 71 U/L — AB (ref 9–46)
AST: 27 U/L (ref 10–35)
Alkaline Phosphatase: 60 U/L (ref 40–115)
BUN: 27 mg/dL — ABNORMAL HIGH (ref 7–25)
CALCIUM: 9.3 mg/dL (ref 8.6–10.3)
CO2: 27 mmol/L (ref 20–31)
CREATININE: 0.95 mg/dL (ref 0.70–1.25)
Chloride: 103 mmol/L (ref 98–110)
Glucose, Bld: 115 mg/dL — ABNORMAL HIGH (ref 65–99)
Potassium: 3.8 mmol/L (ref 3.5–5.3)
SODIUM: 139 mmol/L (ref 135–146)
TOTAL PROTEIN: 6.7 g/dL (ref 6.1–8.1)
Total Bilirubin: 0.9 mg/dL (ref 0.2–1.2)

## 2016-04-27 LAB — LIPID PANEL
CHOL/HDL RATIO: 5.3 ratio — AB (ref <=5.0)
CHOLESTEROL: 165 mg/dL (ref 125–200)
HDL: 31 mg/dL — ABNORMAL LOW (ref >=40)
LDL Cholesterol: 101 mg/dL (ref <130)
Triglycerides: 164 mg/dL — ABNORMAL HIGH (ref <150)
VLDL: 33 mg/dL — ABNORMAL HIGH (ref <30)

## 2016-05-04 ENCOUNTER — Ambulatory Visit (INDEPENDENT_AMBULATORY_CARE_PROVIDER_SITE_OTHER): Payer: Managed Care, Other (non HMO) | Admitting: Cardiology

## 2016-05-04 ENCOUNTER — Encounter: Payer: Self-pay | Admitting: Cardiology

## 2016-05-04 VITALS — BP 176/110 | HR 64 | Ht 65.0 in | Wt 180.0 lb

## 2016-05-04 DIAGNOSIS — I10 Essential (primary) hypertension: Secondary | ICD-10-CM

## 2016-05-04 NOTE — Progress Notes (Signed)
05/04/2016 [name]   12-14-1956  [id number]  Primary Physician No PCP Per Patient Primary Cardiologist: Dr. Burt Knack   Reason for Visit/CC: 1 Month F/u for HTN  HPI:  60 y/o male, followed by Dr. Burt Knack, who presents back to clinic for 1 month f/u. He was recently seen by Dr. Burt Knack 03/30/16 for routine f/u and was noted to be hypertensive with an office reading of 190/108. His BP meds were adjusted and he was instructed to f/u for reassessment.  In addition to his HTN, his other medical issues include coronary artery disease s/p PCI of the LAD in 2002 when he presented with exertional angina. At that time he presented with chest pain and had an abnormal stress test demonstrating anterior ischemia. He was found to have severe LAD stenosis and was ultimately treated with 3 bare-metal stents in the mid and distal LAD. He's been unable to tolerate statin drugs and has failed 3 different agents secondary to myalgias. He also has a long history of whitecoat hypertension.  On 03/30/16, Dr. Burt Knack instructed him to increase his carvedilol to 6.25 mg BID, he added chlorthalidone, 25 mg daily, and added back amlodipine, 5 mg. He was also instructed to get a CMP to assess renal function and K. His CMP was done 04/27/16. Renal function was normal. Scr was 0.95. K also stable at 3.8.   His BP in clinic today is high. Initial BP is 176/110. This was rechecked after several minutes resting, with feet flat on the floor and his pressure remained elevated at 164/100. He reports full medication compliance at home. There is discrepancy between his home readings and cilnic readings. He states pressures at home over the last few days have been in the upper [id number] systolic and [id number] diastolic.   He denies CP. No dyspnea.    Current Outpatient Prescriptions  Medication Sig Dispense Refill  . amLODipine (NORVASC) 5 MG tablet TAKE 1 TABLET (5 MG TOTAL) BY MOUTH DAILY. 30 tablet 11  . aspirin 81 MG tablet Take 81 mg by  mouth daily.      . carvedilol (COREG) 6.25 MG tablet Take 1 tablet (6.25 mg total) by mouth 2 (two) times daily with a meal. 60 tablet 11  . chlorthalidone (HYGROTON) 25 MG tablet Take 1 tablet (25 mg total) by mouth daily. 30 tablet 11  . CRESTOR 5 MG tablet TAKE ONE TABLET BY MOUTH ON MONDAY, WEDNESDAY AND FRIDAY 30 tablet 5  . fish oil-omega-3 fatty acids 1000 MG capsule Take 2 g by mouth daily.      . FOLBIC 2.5-25-2 MG TABS tablet TAKE 1 TABLET BY MOUTH EVERY DAY 30 tablet 3  . ramipril (ALTACE) 10 MG capsule TAKE 1 CAPSULE BY MOUTH TWICE A DAY 60 capsule 11  . vitamin C (ASCORBIC ACID) 500 MG tablet Take 500 mg by mouth daily.       No current facility-administered medications for this visit.    Allergies  Allergen Reactions  . Atorvastatin     REACTION: myalgias  . Tribenzor [Olmesartan-Amlodipine-Hctz] Other (See Comments)    Joint pain    Social History   Social History  . Marital Status: Married    Spouse Name: N/A  . Number of Children: N/A  . Years of Education: N/A   Occupational History  . Dealer     for [employer] History Main Topics  . Smoking status: Former Smoker    Start date: 01/29/1974  . Smokeless  tobacco: Former Systems developer    Quit date: 01/30/1984  . Alcohol Use: No  . Drug Use: No  . Sexual Activity: Not on file   Other Topics Concern  . Not on file   Social History Narrative   Married 20 yrs to his second wife   3 children 32, 60, 15     Review of Systems: General: negative for chills, fever, night sweats or weight changes.  Cardiovascular: negative for chest pain, dyspnea on exertion, edema, orthopnea, palpitations, paroxysmal nocturnal dyspnea or shortness of breath Dermatological: negative for rash Respiratory: negative for cough or wheezing Urologic: negative for hematuria Abdominal: negative for nausea, vomiting, diarrhea, bright red blood per rectum, melena, or hematemesis Neurologic: negative for visual changes,  syncope, or dizziness All other systems reviewed and are otherwise negative except as noted above.    Blood pressure 176/110, pulse 64, height 5\' 5"  (1.651 m), weight 180 lb (81.647 kg).  General appearance: alert, cooperative and no distress Neck: no carotid bruit and no JVD Lungs: clear to auscultation bilaterally Heart: regular rate and rhythm, S1, S2 normal, no murmur, click, rub or gallop Extremities: no LEE Pulses: 2+ and symmetric Skin: warm and dry Neurologic: Grossly normal  EKG not performed  ASSESSMENT AND PLAN:   1. HTN: h/o white coat syndrome with severely elevated clinic BP readings. He notes his BP at home is better controlled in the [id number] systolic, however he has a wrist cuff and is unsure of its accuracy. These are occasional readings, as he has not been strictly monitoring his pressures. He is currently on 4 antihypertensive medications, including a BB, CCB, ACE-I and thiazide diuretic. Given his severe anxiety at [employer], we will further evaluate with a 24 hr ambulatory BP monitor to better assess his BP during a typical routine day for him, work/ home ect. We will use the results to further guide medication adjustments. Patient also reminded to avoid sodium, caffeine and ETOH. He is to f/u in the HTN clinic in 1-2 weeks.   2. CAD: stable w/o angina. Followed by Dr. Burt Knack.    PLAN  F/u in the HTN clinic in 1-2 weeks with pharmacist.   Lyda Jester PA-C 05/04/2016 8:57 AM

## 2016-05-04 NOTE — Patient Instructions (Addendum)
Medication Instructions:  None  Labwork: None  Testing/Procedures: Your physician would like for you to wear a 24 hour blood pressure monitor.  The monitor will check your blood pressure periodically and record the readings.  Follow-Up: Your physician recommends that you schedule a follow-up appointment in: 1-2 weeks with our pharmacist in the hypertension clinic.   Any Other Special Instructions Will Be Listed Below (If Applicable).     If you need a refill on your cardiac medications before your next appointment, please call your pharmacy.

## 2016-05-04 NOTE — Addendum Note (Signed)
Addended by: Loren Racer on: 05/04/2016 09:10 AM   Modules accepted: Orders

## 2016-05-07 ENCOUNTER — Ambulatory Visit (INDEPENDENT_AMBULATORY_CARE_PROVIDER_SITE_OTHER): Payer: Managed Care, Other (non HMO)

## 2016-05-07 ENCOUNTER — Encounter: Payer: Self-pay | Admitting: *Deleted

## 2016-05-07 DIAGNOSIS — I10 Essential (primary) hypertension: Secondary | ICD-10-CM

## 2016-05-07 NOTE — Progress Notes (Signed)
Patient ID: [name], male   DOB: 1956/10/09, 60 y.o.   MRN: [id number] 24 hour ambulatory blood pressure monitor applied to patient.

## 2016-05-12 ENCOUNTER — Telehealth: Payer: Self-pay | Admitting: Cardiovascular Disease

## 2016-05-12 DIAGNOSIS — R748 Abnormal levels of other serum enzymes: Secondary | ICD-10-CM

## 2016-05-12 NOTE — Telephone Encounter (Signed)
I spoke with the pt and made him aware of lab results and 24 hour BP monitor.  The pt will work on diet and exercise at this time and repeat liver function in 3 months (08/10/16).  HTN clinic appointment will be cancelled due to results of 24 hour BP monitor. Pt agreed with plan.

## 2016-05-12 NOTE — Telephone Encounter (Signed)
New Message  Pt request a call back to discuss the BP test and lab work. Request a call back to discuss the results.

## 2016-05-13 ENCOUNTER — Other Ambulatory Visit: Payer: Self-pay | Admitting: Cardiovascular Disease

## 2016-05-21 ENCOUNTER — Ambulatory Visit: Payer: Managed Care, Other (non HMO) | Admitting: Pharmacist

## 2016-08-10 ENCOUNTER — Encounter (INDEPENDENT_AMBULATORY_CARE_PROVIDER_SITE_OTHER): Payer: Self-pay

## 2016-08-10 ENCOUNTER — Other Ambulatory Visit: Payer: Managed Care, Other (non HMO) | Admitting: *Deleted

## 2016-08-10 DIAGNOSIS — R748 Abnormal levels of other serum enzymes: Secondary | ICD-10-CM

## 2016-08-10 LAB — HEPATIC FUNCTION PANEL
ALBUMIN: 4.2 g/dL (ref 3.6–5.1)
ALT: 80 U/L — AB (ref 9–46)
AST: 31 U/L (ref 10–35)
Alkaline Phosphatase: 62 U/L (ref 40–115)
Bilirubin, Direct: 0.1 mg/dL (ref <=0.2)
Indirect Bilirubin: 0.5 mg/dL (ref 0.2–1.2)
Total Bilirubin: 0.6 mg/dL (ref 0.2–1.2)
Total Protein: 6.3 g/dL (ref 6.1–8.1)

## 2016-08-25 ENCOUNTER — Telehealth: Payer: Self-pay | Admitting: Cardiovascular Disease

## 2016-08-25 DIAGNOSIS — E785 Hyperlipidemia, unspecified: Secondary | ICD-10-CM

## 2016-08-25 NOTE — Telephone Encounter (Signed)
New message  Pt call requesting to speak with RN about Lab results. Please call back to discuss

## 2016-08-25 NOTE — Telephone Encounter (Signed)
I left the pt a detailed voicemail with results of repeat hepatic panel. Will plan to recheck lab work 02/18/2017.    Notes Recorded by Sherren Mocha, MD on 08/16/2016 at 9:45 PM EDT LFT's elevated but <3x ULN. Continue same Rx and recheck lipids and LFT's in 6 months

## 2017-02-18 ENCOUNTER — Other Ambulatory Visit: Payer: Self-pay | Admitting: *Deleted

## 2017-02-18 ENCOUNTER — Encounter (INDEPENDENT_AMBULATORY_CARE_PROVIDER_SITE_OTHER): Payer: Self-pay

## 2017-02-18 DIAGNOSIS — E7800 Pure hypercholesterolemia, unspecified: Secondary | ICD-10-CM

## 2017-02-18 DIAGNOSIS — I10 Essential (primary) hypertension: Secondary | ICD-10-CM

## 2017-02-18 LAB — LIPID PANEL
Chol/HDL Ratio: 5.2 — ABNORMAL HIGH (ref 0.0–5.0)
Cholesterol, Total: 170 mg/dL (ref 100–199)
HDL: 33 mg/dL — ABNORMAL LOW (ref >39)
LDL Calculated: 100 — ABNORMAL HIGH (ref 0–99)
Triglycerides: 184 mg/dL — ABNORMAL HIGH (ref 0–149)
VLDL CHOLESTEROL CAL: 37 (ref 5–40)

## 2017-02-18 LAB — HEPATIC FUNCTION PANEL
ALBUMIN: 4.6 g/dL (ref 3.6–4.8)
ALT: 86 IU/L — ABNORMAL HIGH (ref 0–44)
AST: 33 IU/L (ref 0–40)
Alkaline Phosphatase: 53 IU/L (ref 39–117)
Bilirubin Total: 0.5 mg/dL (ref 0.0–1.2)
Bilirubin, Direct: 0.15 mg/dL (ref 0.00–0.40)
TOTAL PROTEIN: 6.8 g/dL (ref 6.0–8.5)

## 2017-02-26 ENCOUNTER — Telehealth: Payer: Self-pay | Admitting: Cardiovascular Disease

## 2017-03-12 ENCOUNTER — Other Ambulatory Visit: Payer: Self-pay | Admitting: Cardiovascular Disease

## 2017-03-12 DIAGNOSIS — I2510 Atherosclerotic heart disease of native coronary artery without angina pectoris: Secondary | ICD-10-CM

## 2017-03-12 DIAGNOSIS — E785 Hyperlipidemia, unspecified: Secondary | ICD-10-CM

## 2017-03-12 DIAGNOSIS — I10 Essential (primary) hypertension: Secondary | ICD-10-CM

## 2017-03-26 ENCOUNTER — Other Ambulatory Visit: Payer: Self-pay | Admitting: Cardiovascular Disease

## 2017-03-26 DIAGNOSIS — I10 Essential (primary) hypertension: Secondary | ICD-10-CM

## 2017-03-26 DIAGNOSIS — I2510 Atherosclerotic heart disease of native coronary artery without angina pectoris: Secondary | ICD-10-CM

## 2017-03-26 DIAGNOSIS — E785 Hyperlipidemia, unspecified: Secondary | ICD-10-CM

## 2017-03-26 NOTE — Telephone Encounter (Signed)
Closed encounter °

## 2017-04-01 ENCOUNTER — Encounter: Payer: Self-pay | Admitting: Cardiovascular Disease

## 2017-04-16 ENCOUNTER — Encounter (INDEPENDENT_AMBULATORY_CARE_PROVIDER_SITE_OTHER): Payer: Self-pay

## 2017-04-16 ENCOUNTER — Encounter: Payer: Self-pay | Admitting: Cardiovascular Disease

## 2017-04-16 ENCOUNTER — Ambulatory Visit (INDEPENDENT_AMBULATORY_CARE_PROVIDER_SITE_OTHER): Payer: Managed Care, Other (non HMO) | Admitting: Cardiovascular Disease

## 2017-04-16 VITALS — BP 140/100 | HR 58 | Ht 66.0 in | Wt 184.0 lb

## 2017-04-16 DIAGNOSIS — I10 Essential (primary) hypertension: Secondary | ICD-10-CM | POA: Diagnosis not present

## 2017-04-16 DIAGNOSIS — E782 Mixed hyperlipidemia: Secondary | ICD-10-CM

## 2017-04-16 DIAGNOSIS — I2510 Atherosclerotic heart disease of native coronary artery without angina pectoris: Secondary | ICD-10-CM

## 2017-04-16 DIAGNOSIS — R748 Abnormal levels of other serum enzymes: Secondary | ICD-10-CM

## 2017-04-16 NOTE — Patient Instructions (Signed)
Medication Instructions:  Your physician recommends that you continue on your current medications as directed. Please refer to the Current Medication list given to you today.  Labwork: Your physician recommends that you have lab work in August: CMP  Testing/Procedures: No new orders.   Follow-Up: Your physician wants you to follow-up in: 1 YEAR with Dr Burt Knack.  You will receive a reminder letter in the mail two months in advance. If you don't receive a letter, please call our office to schedule the follow-up appointment.  Any Other Special Instructions Will Be Listed Below (If Applicable).     If you need a refill on your cardiac medications before your next appointment, please call your pharmacy.

## 2017-04-16 NOTE — Progress Notes (Signed)
Cardiology Office Note Date:  04/16/2017   ID:  Vincent, Terry 1956-03-28, MRN [id number]  PCP:  No PCP Per Patient  Cardiologist:  Sherren Mocha, MD    Chief Complaint  Patient presents with  . Follow-up     History of Present Illness: [name] is a 61 y.o. male who presents for follow-up evaluation. The patient has been followed for coronary artery disease and underwent PCI of the LAD in 2002 when he presented with exertional angina. At that time he presented with chest pain and had an abnormal stress test demonstrating anterior ischemia. He was found to have severe LAD stenosis and was ultimately treated with 3 bare-metal stents in the mid and distal LAD. He's been unable to tolerate statin drugs and has failed 3 different agents secondary to myalgias. He also has a long history whitecoat hypertension.  When he was seen last year he was noted to have severe hypertension and his antihypertensive medication regime was adjusted. He will return for follow-up and remains severely hypertensive. However, he has a long history of whitecoat hypertension and has a high degree of anxiety around physician office visits. A 24-hour ambulatory blood pressure monitor was applied and demonstrated good blood pressure control with 24 hour average blood pressure of 131/82, awake blood pressure average of 136/86, and asleep blood pressure average of 119/71 mmHg. Review of laboratory data demonstrates normal renal function with a creatinine of 0.95. Liver function tests have been chronically elevated with a normal alkaline phosphatase and bilirubin, that increased ALT of 86 at the time of last assessment in February 2018. AST has been in normal range at 33.  The patient is here alone today. He is doing well from a cardiac perspective. He walks the stairs that work frequently and has no symptoms. Today, he denies symptoms of palpitations, chest pain, shortness of breath, orthopnea, PND, lower extremity  edema, dizziness, or syncope.  Past Medical History:  Diagnosis Date  . CAD (coronary artery disease)    post stent X 3 to LAD  . Dyslipidemia   . Hypertension     Past Surgical History:  Procedure Laterality Date  . EYE MUSCLE SURGERY      Current Outpatient Prescriptions  Medication Sig Dispense Refill  . amLODipine (NORVASC) 5 MG tablet TAKE 1 TABLET (5 MG TOTAL) BY MOUTH DAILY. 30 tablet 1  . aspirin 81 MG tablet Take 81 mg by mouth daily.      . carvedilol (COREG) 6.25 MG tablet Take 1 tablet (6.25 mg total) by mouth 2 (two) times daily with a meal. 60 tablet 11  . chlorthalidone (HYGROTON) 25 MG tablet TAKE 1 TABLET (25 MG TOTAL) BY MOUTH DAILY. 30 tablet 0  . fish oil-omega-3 fatty acids 1000 MG capsule Take 2 g by mouth daily.      . FOLBIC 2.5-25-2 MG TABS tablet TAKE 1 TABLET BY MOUTH EVERY DAY 30 tablet 3  . ramipril (ALTACE) 10 MG capsule TAKE 1 CAPSULE BY MOUTH TWICE A DAY 60 capsule 11  . rosuvastatin (CRESTOR) 5 MG tablet TAKE ONE TABLET BY MOUTH ON MONDAY, WEDNESDAY AND FRIDAY 15 tablet 11   No current facility-administered medications for this visit.     Allergies:   Atorvastatin and Tribenzor [olmesartan-amlodipine-hctz]   Social History:  The patient  reports that he has quit smoking. He started smoking about 43 years ago. He quit smokeless tobacco use about 33 years ago. He reports that he does not drink alcohol  or use drugs.   Family History:  The patient's  family history includes Cancer in his father; Coronary artery disease in his mother; Diabetes in his mother; Heart attack in his mother; Hypertension in his mother.    ROS:  Please see the history of present illness.    All other systems are reviewed and negative.    PHYSICAL EXAM: VS:  BP (!) 140/100   Pulse (!) 58   Ht _0  (1.676 m)   Wt 184 lb (83.5 kg)   SpO2 97%   BMI 29.70 kg/m  , BMI Body mass index is 29.7 kg/m. GEN: Well nourished, well developed, in no acute distress  HEENT: normal   Neck: no JVD, no masses. No carotid bruits Cardiac: RRR without murmur or gallop                Respiratory:  clear to auscultation bilaterally, normal work of breathing GI: soft, nontender, nondistended, + BS MS: no deformity or atrophy  Ext: no pretibial edema, pedal pulses 2+= bilaterally Skin: warm and dry, no rash Neuro:  Strength and sensation are intact Psych: euthymic mood, full affect  EKG:  EKG is ordered today. The ekg ordered today shows sinus bradycardia 58 bpm, within normal limits.  Recent Labs: 04/27/2016: BUN 27; Creat 0.95; Potassium 3.8; Sodium 139 02/18/2017: ALT 86   Lipid Panel     Component Value Date/Time   CHOL 170 02/18/2017 0734   TRIG 184 (H) 02/18/2017 0734   TRIG 79 01/03/2007 0737   HDL 33 (L) 02/18/2017 0734   CHOLHDL 5.2 (H) 02/18/2017 0734   CHOLHDL 5.3 (H) 04/27/2016 0745   VLDL 33 (H) 04/27/2016 0745   LDLCALC 100 (H) 02/18/2017 0734      Wt Readings from Last 3 Encounters:  04/16/17 184 lb (83.5 kg)  05/04/16 180 lb (81.6 kg)  03/30/16 180 lb 6.4 oz (81.8 kg)    ASSESSMENT AND PLAN: 1.  Hypertension:Blood pressure is well controlled based on 24-hour ambulatory blood pressure monitoring and has home readings. He has significant whitecoat hypertension. He will continue on his current medical program. Will update a renal profile in August. Clinical follow-up in one year.  2. Coronary artery disease, native vessel, without angina: The patient is treated with aspirin, a statin drug, a beta blocker, and an ACE inhibitor.  3. Hyperlipidemia: Lipids reviewed as above. LDL is above goal but statin dose limited because of myalgias. Also LFTs elevated with an ALT approximately 2 times the upper limit of normal. He will continue Crestor once per week. We discussed the importance of weight loss, diet, and exercise.  4. Elevated liver function tests: Isolated elevation of ALT. Continue every six-month surveillance. Discussed the importance of weight  loss and healthy eating to minimize effects of potential fatty liver infiltration.  Current medicines are reviewed with the patient today.  The patient does not have concerns regarding medicines.  Labs/ tests ordered today include:   Orders Placed This Encounter  Procedures  . Comp Met (CMET)  . EKG 12-Lead   Disposition:   FU one year  Signed, Sherren Mocha, MD  04/16/2017 1:20 PM    [facility] [place], [place], [place]  [address] Phone: [phone]; Fax: [phone]

## 2017-05-01 ENCOUNTER — Other Ambulatory Visit: Payer: Self-pay | Admitting: Cardiovascular Disease

## 2017-05-03 ENCOUNTER — Other Ambulatory Visit: Payer: Self-pay | Admitting: Cardiovascular Disease

## 2017-05-03 DIAGNOSIS — I10 Essential (primary) hypertension: Secondary | ICD-10-CM

## 2017-05-03 DIAGNOSIS — I2510 Atherosclerotic heart disease of native coronary artery without angina pectoris: Secondary | ICD-10-CM

## 2017-05-03 DIAGNOSIS — E785 Hyperlipidemia, unspecified: Secondary | ICD-10-CM

## 2017-05-10 ENCOUNTER — Other Ambulatory Visit: Payer: Self-pay | Admitting: Cardiovascular Disease

## 2017-05-10 DIAGNOSIS — I10 Essential (primary) hypertension: Secondary | ICD-10-CM

## 2017-05-10 DIAGNOSIS — I2510 Atherosclerotic heart disease of native coronary artery without angina pectoris: Secondary | ICD-10-CM

## 2017-05-10 DIAGNOSIS — E785 Hyperlipidemia, unspecified: Secondary | ICD-10-CM

## 2017-05-21 ENCOUNTER — Other Ambulatory Visit: Payer: Self-pay | Admitting: Cardiovascular Disease

## 2017-05-21 DIAGNOSIS — I10 Essential (primary) hypertension: Secondary | ICD-10-CM

## 2017-05-21 DIAGNOSIS — E785 Hyperlipidemia, unspecified: Secondary | ICD-10-CM

## 2017-05-21 DIAGNOSIS — I2510 Atherosclerotic heart disease of native coronary artery without angina pectoris: Secondary | ICD-10-CM

## 2017-05-24 ENCOUNTER — Other Ambulatory Visit: Payer: Self-pay | Admitting: Cardiovascular Disease

## 2017-05-24 DIAGNOSIS — E785 Hyperlipidemia, unspecified: Secondary | ICD-10-CM

## 2017-05-24 DIAGNOSIS — I10 Essential (primary) hypertension: Secondary | ICD-10-CM

## 2017-05-24 DIAGNOSIS — I2510 Atherosclerotic heart disease of native coronary artery without angina pectoris: Secondary | ICD-10-CM

## 2017-05-25 NOTE — Telephone Encounter (Signed)
Medication Detail    Disp Refills Start End   chlorthalidone (HYGROTON) 25 MG tablet 30 tablet 6 05/10/2017    Sig - Route: TAKE 1 TABLET (25 MG TOTAL) BY MOUTH DAILY. - Oral   E-Prescribing Status: Receipt confirmed by pharmacy (05/10/2017 4:03 PM EDT)   Associated Diagnoses   Atherosclerosis of native coronary artery of native heart without angina pectoris     Essential hypertension     Hyperlipidemia     Pharmacy   [id number] - [place], [place] - [address]

## 2017-06-08 ENCOUNTER — Other Ambulatory Visit: Payer: Self-pay | Admitting: Cardiovascular Disease

## 2017-06-08 DIAGNOSIS — I10 Essential (primary) hypertension: Secondary | ICD-10-CM

## 2017-06-08 DIAGNOSIS — E785 Hyperlipidemia, unspecified: Secondary | ICD-10-CM

## 2017-06-08 DIAGNOSIS — I2510 Atherosclerotic heart disease of native coronary artery without angina pectoris: Secondary | ICD-10-CM

## 2017-08-02 ENCOUNTER — Other Ambulatory Visit: Payer: Managed Care, Other (non HMO) | Admitting: *Deleted

## 2017-08-02 ENCOUNTER — Encounter (INDEPENDENT_AMBULATORY_CARE_PROVIDER_SITE_OTHER): Payer: Self-pay

## 2017-08-02 DIAGNOSIS — I2510 Atherosclerotic heart disease of native coronary artery without angina pectoris: Secondary | ICD-10-CM

## 2017-08-02 DIAGNOSIS — E782 Mixed hyperlipidemia: Secondary | ICD-10-CM

## 2017-08-02 DIAGNOSIS — I10 Essential (primary) hypertension: Secondary | ICD-10-CM

## 2017-08-02 DIAGNOSIS — R748 Abnormal levels of other serum enzymes: Secondary | ICD-10-CM

## 2017-08-02 LAB — COMPREHENSIVE METABOLIC PANEL
ALK PHOS: 65 IU/L (ref 39–117)
ALT: 65 IU/L — ABNORMAL HIGH (ref 0–44)
AST: 24 IU/L (ref 0–40)
Albumin/Globulin Ratio: 2.1 (ref 1.2–2.2)
Albumin: 4.5 g/dL (ref 3.6–4.8)
BUN/Creatinine Ratio: 17 (ref 10–24)
BUN: 17 mg/dL (ref 8–27)
Bilirubin Total: 0.5 mg/dL (ref 0.0–1.2)
CALCIUM: 8.9 mg/dL (ref 8.6–10.2)
CO2: 23 mmol/L (ref 20–29)
CREATININE: 0.99 mg/dL (ref 0.76–1.27)
Chloride: 103 mmol/L (ref 96–106)
GFR calc Af Amer: 94 mL/min/{1.73_m2} (ref >59)
GFR, EST NON AFRICAN AMERICAN: 81 mL/min/{1.73_m2} (ref >59)
GLOBULIN, TOTAL: 2.1 g/dL (ref 1.5–4.5)
GLUCOSE: 106 mg/dL — AB (ref 65–99)
Potassium: 4.3 mmol/L (ref 3.5–5.2)
Sodium: 141 mmol/L (ref 134–144)
Total Protein: 6.6 g/dL (ref 6.0–8.5)

## 2017-08-03 ENCOUNTER — Encounter: Payer: Self-pay | Admitting: Cardiovascular Disease

## 2017-08-03 NOTE — Telephone Encounter (Signed)
[name] retuning a call about his lab results. Thanks

## 2017-08-03 NOTE — Telephone Encounter (Signed)
This encounter was created in error - please disregard.

## 2017-09-06 ENCOUNTER — Other Ambulatory Visit: Payer: Self-pay | Admitting: Cardiovascular Disease

## 2018-01-04 ENCOUNTER — Encounter: Payer: Self-pay | Admitting: Internal Medicine

## 2018-01-04 ENCOUNTER — Ambulatory Visit: Payer: Managed Care, Other (non HMO) | Admitting: Internal Medicine

## 2018-01-04 VITALS — BP 160/94 | HR 67 | Temp 98.1°F | Ht 65.0 in | Wt 175.0 lb

## 2018-01-04 DIAGNOSIS — I2510 Atherosclerotic heart disease of native coronary artery without angina pectoris: Secondary | ICD-10-CM | POA: Diagnosis not present

## 2018-01-04 DIAGNOSIS — Z0000 Encounter for general adult medical examination without abnormal findings: Secondary | ICD-10-CM

## 2018-01-04 DIAGNOSIS — M170 Bilateral primary osteoarthritis of knee: Secondary | ICD-10-CM | POA: Diagnosis not present

## 2018-01-04 DIAGNOSIS — Z23 Encounter for immunization: Secondary | ICD-10-CM

## 2018-01-04 DIAGNOSIS — I10 Essential (primary) hypertension: Secondary | ICD-10-CM | POA: Diagnosis not present

## 2018-01-04 DIAGNOSIS — Z125 Encounter for screening for malignant neoplasm of prostate: Secondary | ICD-10-CM | POA: Diagnosis not present

## 2018-01-04 LAB — COMPREHENSIVE METABOLIC PANEL
ALT: 51 U/L (ref 0–53)
AST: 24 U/L (ref 0–37)
Albumin: 4.8 g/dL (ref 3.5–5.2)
Alkaline Phosphatase: 68 U/L (ref 39–117)
BILIRUBIN TOTAL: 0.6 mg/dL (ref 0.2–1.2)
BUN: 22 mg/dL (ref 6–23)
CHLORIDE: 105 meq/L (ref 96–112)
CO2: 27 mEq/L (ref 19–32)
CREATININE: 0.9 mg/dL (ref 0.40–1.50)
Calcium: 9.3 mg/dL (ref 8.4–10.5)
GFR: 90.59 mL/min (ref >60.00)
GLUCOSE: 109 mg/dL — AB (ref 70–99)
Potassium: 4.1 mEq/L (ref 3.5–5.1)
SODIUM: 140 meq/L (ref 135–145)
Total Protein: 7.1 g/dL (ref 6.0–8.3)

## 2018-01-04 LAB — CBC
HEMATOCRIT: 47.1 % (ref 39.0–52.0)
Hemoglobin: 16 g/dL (ref 13.0–17.0)
MCHC: 33.9 g/dL (ref 30.0–36.0)
MCV: 87.5 fl (ref 78.0–100.0)
PLATELETS: 209 10*3/uL (ref 150.0–400.0)
RBC: 5.38 Mil/uL (ref 4.22–5.81)
RDW: 13.8 % (ref 11.5–15.5)
WBC: 7 10*3/uL (ref 4.0–10.5)

## 2018-01-04 LAB — PSA: PSA: 0.67 ng/mL (ref 0.10–4.00)

## 2018-01-04 LAB — LIPID PANEL
CHOL/HDL RATIO: 5
Cholesterol: 173 mg/dL (ref 0–200)
HDL: 35.3 mg/dL — ABNORMAL LOW (ref >39.00)
NONHDL: 137.94
Triglycerides: 208 mg/dL — ABNORMAL HIGH (ref 0.0–149.0)
VLDL: 41.6 mg/dL — AB (ref 0.0–40.0)

## 2018-01-04 LAB — LDL CHOLESTEROL, DIRECT: LDL DIRECT: 112 mg/dL

## 2018-01-04 NOTE — Addendum Note (Signed)
Addended by: Pilar Grammes on: 01/04/2018 12:38 PM   Modules accepted: Orders

## 2018-01-04 NOTE — Progress Notes (Signed)
Subjective:    Patient ID: [name], male    DOB: 10-01-1956, 62 y.o.   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 amLODipine (NORVASC) 5 MG tablet TAKE 1 TABLET (5 MG TOTAL) BY MOUTH DAILY. 30 tablet 10  . aspirin 81 MG tablet Take 81 mg by mouth daily.      . carvedilol (COREG) 6.25 MG tablet TAKE 1 TABLET (6.25 MG TOTAL) BY MOUTH 2 (TWO) TIMES DAILY WITH A MEAL. 60 tablet 9  . chlorthalidone (HYGROTON) 25 MG tablet TAKE 1 TABLET (25 MG TOTAL) BY MOUTH DAILY. 30 tablet 6  . fish oil-omega-3 fatty acids 1000 MG capsule Take 2 g by mouth daily.      Mar[place] Kitchen OVER THE COUNTER MEDICATION Omega Q plus Resveratrol and Turmeric    . ramipril (ALTACE) 10 MG capsule TAKE 1 CAPSULE BY MOUTH TWICE A DAY 60 capsule 11  . rosuvastatin (CRESTOR) 5 MG tablet TAKE ONE TABLET BY MOUTH ON MONDAY, WEDNESDAY AND FRIDAY 15 tablet 11   No current facility-administered medications on file prior to visit.     Allergies  Allergen Reactions  . Atorvastatin     REACTION: myalgias  . Tribenzor [Olmesartan-Amlodipine-Hctz] Other (See Comments)    Joint pain    Past Medical History:  Diagnosis Date  . CAD (coronary artery disease)    post stent X 3 to LAD  . Dyslipidemia   . Hypertension     Past Surgical History:  Procedure Laterality Date  . CORONARY STENT PLACEMENT  2002  . EYE MUSCLE  SURGERY      Family History  Problem Relation Age of Onset  . Coronary artery disease Mother   . Heart attack Mother        early 36's  . Diabetes Mother   . Hypertension Mother   . Cancer Father        lung     Social History   Socioeconomic History  . Marital status: Married    Spouse name: Not on file  . Number of children: 3  . Years of education: Not on file  . Highest education level: Not on file  Social Needs  . Financial resource strain: Not on file  . Food insecurity - worry: Not on file  . Food insecurity - inability: Not on file  . Transportation needs - medical: Not on file  . Transportation needs - non-medical: Not on file  Occupational History  . 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 Smoking status: Former Smoker    Start date: 01/29/1974    Last attempt to quit: 12/28/1978    Years since quitting: 39.0  . Smokeless tobacco: Former Systems developer    Quit date: 01/30/1984  Substance and Sexual Activity  .  Alcohol use: No  . Drug use: No  . Sexual activity: Not on file  Other Topics Concern  . Not on file  Social History Narrative   Divorced and remarried 1990's   1 son from first marriage, 2 from second   Review of Systems  Constitutional: Negative for fatigue and unexpected weight change.       Wears seat belt  HENT: Negative for dental problem, hearing loss and tinnitus.        Overdue for dentist--discussed  Eyes: Negative for visual disturbance.       No diplopia or unilateral vision loss  Respiratory: Negative for cough, chest tightness and shortness of breath.   Cardiovascular: Negative for chest pain, palpitations and leg swelling.  Gastrointestinal: Negative for abdominal pain, blood in stool and constipation.       No heartburn  Endocrine: Negative for polydipsia and polyuria.  Genitourinary: Negative for frequency and urgency.       Nocturia now  1-2.  No sig daytime issues No sexual problems  Musculoskeletal: Positive for arthralgias. Negative for back pain and joint swelling.       Knees and fingers get stiff  Skin: Positive for rash.       Mild psoriasis--mostly in winter. OTC creams only  Allergic/Immunologic: Negative for environmental allergies and immunocompromised state.  Neurological: Negative for dizziness, syncope, light-headedness and headaches.  Hematological: Negative for adenopathy. Does not bruise/bleed easily.  Psychiatric/Behavioral: Negative for dysphoric mood and sleep disturbance. The patient is not nervous/anxious.        Objective:   Physical Exam  Constitutional: He is oriented to person, place, and time. He appears well-developed and well-nourished. No distress.  HENT:  Head: Normocephalic and atraumatic.  Right Ear: External ear normal.  Left Ear: External ear normal.  Mouth/Throat: Oropharynx is clear and moist. No oropharyngeal exudate.  Eyes: Conjunctivae are normal. Pupils are equal, round, and reactive to light.  Neck: Normal range of motion. No thyromegaly present.  Cardiovascular: Normal rate, regular rhythm, normal heart sounds and intact distal pulses. Exam reveals no gallop.  No murmur heard. Pulmonary/Chest: Effort normal and breath sounds normal. No respiratory distress. He has no wheezes. He has no rales.  Abdominal: Soft. There is no tenderness.  Musculoskeletal: He exhibits no edema or tenderness.  Mild crepitus in knees but no swelling, instability or ligament/meniscus findings  Lymphadenopathy:    He has no cervical adenopathy.  Neurological: He is alert and oriented to person, place, and time.  Skin: No rash noted. No erythema.  Psychiatric: He has a normal mood and affect. His behavior is normal.          Assessment & Plan:

## 2018-01-04 NOTE — Assessment & Plan Note (Signed)
No symptoms on current regimen

## 2018-01-04 NOTE — Assessment & Plan Note (Signed)
Colon due 2021 Will check PSA after discussion prevnar today Yearly flu vaccine

## 2018-01-04 NOTE — Assessment & Plan Note (Signed)
BP Readings from Last 3 Encounters:  01/04/18 (!) 160/94  04/16/17 (!) 140/100  05/04/16 (!) 176/110   Chronic white coat HTN and ambulatory testing was okay

## 2018-01-04 NOTE — Assessment & Plan Note (Signed)
Mild Discussed heat, tylenol, protection when kneeling, etc

## 2018-01-05 ENCOUNTER — Encounter: Payer: Self-pay | Admitting: *Deleted

## 2018-03-01 ENCOUNTER — Other Ambulatory Visit: Payer: Self-pay | Admitting: Cardiovascular Disease

## 2018-04-10 NOTE — Progress Notes (Signed)
Cardiology Office Note:    Date:  04/11/2018   ID:  [name], DOB Apr 19, 1956, MRN [id number]  PCP:  Venia Carbon, MD  Cardiologist:  Sherren Mocha, MD  Referring MD: No ref. provider found   Chief Complaint  Patient presents with  . Follow-up    CAD and HTN    History of Present Illness:    [name] is a 62 y.o. male with a past medical history significant for CAD s/p BMS X3 2002, hypertension, and Hyperlipidemia intolerant to statins. [name] has been known to have significant white coat hypertension. A 24 hr ambulatory blood pressure monitor demonstrated good blood pressure control with 24 hour average of 131/82. Past liver function tests have shown chronically elevated ALT with normal alk phos and bilirubin. Most recent ALT in 12/2017 was normal at 51. Renal function was also normal.   [name] is here alone today for 1 year follow up. He reports feeling well. He works full time as an Herbalist and says that he is very active on his job. He takes the stairs and walks a lot. He tries to eat salads several times per week. He eats at [employer], [employer] for lunch.   [name] does not regularly check his blood pressure but in preparation for this appointment he checked it last night - 160/87 and this morning 148/93. He has been taking all of his medications except for chlorthalidone which his insurance stopped paying for. He has been off of it for possibly up to a year.   Lipid panel 01/04/2018: Direct LDL  112, HDL 35.3, triglycerides 208  Past Medical History:  Diagnosis Date  . CAD (coronary artery disease)    post stent X 3 to LAD  . Coronary atherosclerosis of native coronary artery 10/08/2007   Qualifier: Diagnosis of  By: Danelle Earthly CMA, Darlene    . Dyslipidemia   . Essential hypertension 10/07/2007   Qualifier: Diagnosis of  By: Wynona Luna   . HEMOCCULT POSITIVE STOOL 06/17/2010   Qualifier: Diagnosis of  By: Wynona Luna     . Hyperglycemia 03/20/2010   Qualifier: Diagnosis of  By: Wynona Luna   . Hyperlipidemia 10/08/2007   Qualifier: Diagnosis of  By: Danelle Earthly CMA, Darlene    . Hypertension   . Osteoarthritis of both knees 07/16/2008   Qualifier: Diagnosis of  By: Wynona Luna   . Unspecified disorder of prostate 07/29/2010   Qualifier: Diagnosis of  By: Wynona Luna     Past Surgical History:  Procedure Laterality Date  . CORONARY STENT PLACEMENT  2002  . EYE MUSCLE SURGERY      Current Medications: Current Meds  Medication Sig  . amLODipine (NORVASC) 5 MG tablet TAKE 1 TABLET (5 MG TOTAL) BY MOUTH DAILY.  Marland Kitchen aspirin 81 MG tablet Take 81 mg by mouth daily.    . carvedilol (COREG) 6.25 MG tablet TAKE 1 TABLET (6.25 MG TOTAL) BY MOUTH 2 (TWO) TIMES DAILY WITH A MEAL.  . fish oil-omega-3 fatty acids 1000 MG capsule Take 2 g by mouth daily.    Marland Kitchen OVER THE COUNTER MEDICATION Omega Q plus Resveratrol and Turmeric  . ramipril (ALTACE) 10 MG capsule TAKE 1 CAPSULE BY MOUTH TWICE A DAY  . rosuvastatin (CRESTOR) 5 MG tablet TAKE ONE TABLET BY MOUTH ON MONDAY, WEDNESDAY AND FRIDAY     Allergies:   Atorvastatin and Tribenzor [  olmesartan-amlodipine-hctz]   Social History   Socioeconomic History  . Marital status: Married    Spouse name: Not on file  . Number of children: 3  . Years of education: Not on file  . Highest education level: Not on file  Occupational History  . 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 Financial resource strain: Not on file  . Food insecurity:    Worry: Not on file    Inability: Not on file  . Transportation needs:    Medical: Not on file    Non-medical: Not on file  Tobacco Use  . Smoking status: Former Smoker    Start date: 01/29/1974    Last attempt to quit: 12/28/1978    Years since quitting: 39.3  . Smokeless tobacco: Former Systems developer    Quit date: 01/30/1984   Substance and Sexual Activity  . Alcohol use: No  . Drug use: No  . Sexual activity: Not on file  Lifestyle  . Physical activity:    Days per week: Not on file    Minutes per session: Not on file  . Stress: Not on file  Relationships  . Social connections:    Talks on phone: Not on file    Gets together: Not on file    Attends religious service: Not on file    Active member of club or organization: Not on file    Attends meetings of clubs or organizations: Not on file    Relationship status: Not on file  Other Topics Concern  . Not on file  Social History Narrative   Divorced and remarried 1990's   1 son from first marriage, 2 from second     Family History: The patient's family history includes Cancer in his father; Coronary artery disease in his mother; Diabetes in his mother; Heart attack in his mother; Hypertension in his mother. ROS:   Please see the history of present illness.     All other systems reviewed and are negative.  EKGs/Labs/Other Studies Reviewed:    The following studies were reviewed today:  24-hour ambulatory blood pressure monitor  05/07/2016 good blood pressure control with 24 hour average blood pressure of 131/82, awake blood pressure average of 136/86, and asleep blood pressure average of 119/71 mmHg.    EKG:  EKG is  ordered today.  The ekg ordered today demonstrates NSR 61 bpm. No new changes  Recent Labs: 01/04/2018: ALT 51; BUN 22; Creatinine, Ser 0.90; Hemoglobin 16.0; Platelets 209.0; Potassium 4.1; Sodium 140   Recent Lipid Panel    Component Value Date/Time   CHOL 173 01/04/2018 1230   CHOL 170 02/18/2017 0734   TRIG 208.0 (H) 01/04/2018 1230   TRIG 79 01/03/2007 0737   HDL 35.30 (L) 01/04/2018 1230   HDL 33 (L) 02/18/2017 0734   CHOLHDL 5 01/04/2018 1230   VLDL 41.6 (H) 01/04/2018 1230   LDLCALC 100 (H) 02/18/2017 0734   LDLDIRECT 112.0 01/04/2018 1230    Physical Exam:    VS:  BP (!) 166/110, recheck BP in office after  rest- 162/98   Pulse 61   Ht _0  (1.676 m)   Wt 181 lb (82.1 kg)   BMI 29.21 kg/m     Wt Readings from Last 3 Encounters:  04/11/18 181 lb (82.1 kg)  01/04/18 175 lb (79.4 kg)  04/16/17 184 lb (83.5 kg)  Physical Exam  Constitutional: He is oriented to person, place, and time. He appears well-developed and well-nourished. No distress.  HENT:  Head: Normocephalic.  Neck: Normal range of motion. Neck supple. No JVD present.  Cardiovascular: Normal rate, regular rhythm, normal heart sounds and intact distal pulses. Exam reveals no gallop and no friction rub.  No murmur heard. Pulmonary/Chest: Effort normal and breath sounds normal. No respiratory distress. He has no wheezes. He has no rales.  Abdominal: Soft. Bowel sounds are normal. He exhibits no distension. There is no tenderness.  Musculoskeletal: Normal range of motion. He exhibits no edema or deformity.  Neurological: He is alert and oriented to person, place, and time.  Skin: Skin is warm and dry.  Psychiatric: He has a normal mood and affect. His behavior is normal. Thought content normal.  Vitals reviewed.    ASSESSMENT:    1. Atherosclerosis of native coronary artery of native heart without angina pectoris   2. Essential hypertension   3. Pure hypercholesterolemia    PLAN:    In order of problems listed above:  CAD without angina:  No anginal symptoms. Continue secondary prevention with aspirin, statin, beta blocker and ACE-I. Try to improve blood pressure control.   Hypertension: Amlodipine 5 mg, carvedilol 6.25 mg bid, ramipril 10 mg bid. He stopped chlorthalidone due to insurance not covering it. Known hx of whitecoat hypertension however his BP was elevated at home last night and this am. Will provide HCTZ 25 mg daily. He eats a lot of fast food. Discussed lowering his sodium intake as well as processed foods.       DASH diet information given and discussed with pt. Also encouraged walking daily for exercise.  Pt instructed to monitor BP at home and if is consistently >140/90 he should call and will increase his amlodipine to 10 mg daily.   Hyperlipidemia: poor tolerance to statins having failed trial of 3 different statins secondary to myalgias. On Crestor 5 mg once weekly (is having some knee pain) and omega Q plus reservatrol and turmeric as well as fish oil. (advised that taking both of these is probably redundant and could stop one of them). LDL 112 in 12/2017.  Discussed referral to the lipid clinic for consideration of PCSK9-I. He is reluctant. I will arrange for appointment and he will think about whether he wants to keep it or cancel.  Triglycerides were 208, advised on limiting concentrated sweets.   Medication Adjustments/Labs and Tests Ordered: Current medicines are reviewed at length with the patient today.  Concerns regarding medicines are outlined above. Labs and tests ordered and medication changes are outlined in the patient instructions below:  There are no Patient Instructions on file for this visit.   Signed, Daune Perch, NP  04/11/2018 8:52 AM    [place]

## 2018-04-11 ENCOUNTER — Encounter: Payer: Self-pay | Admitting: Cardiology

## 2018-04-11 ENCOUNTER — Encounter (INDEPENDENT_AMBULATORY_CARE_PROVIDER_SITE_OTHER): Payer: Self-pay

## 2018-04-11 ENCOUNTER — Ambulatory Visit: Payer: Managed Care, Other (non HMO) | Admitting: Cardiology

## 2018-04-11 VITALS — BP 166/110 | HR 61 | Ht 66.0 in | Wt 181.0 lb

## 2018-04-11 DIAGNOSIS — I2510 Atherosclerotic heart disease of native coronary artery without angina pectoris: Secondary | ICD-10-CM | POA: Diagnosis not present

## 2018-04-11 DIAGNOSIS — I10 Essential (primary) hypertension: Secondary | ICD-10-CM

## 2018-04-11 DIAGNOSIS — E7800 Pure hypercholesterolemia, unspecified: Secondary | ICD-10-CM | POA: Diagnosis not present

## 2018-04-11 MED ORDER — HYDROCHLOROTHIAZIDE 25 MG PO TABS: 25.0000 mg | ORAL_TABLET | Freq: Every day | ORAL | 3 refills | Status: DC

## 2018-04-11 NOTE — Patient Instructions (Addendum)
Medication Instructions:  1) START Hydrochlorothiazide 25mg  once daily  Labwork: Your physician recommends that you return for lab work in: 1 week (BMET)   Testing/Procedures: None  Follow-Up: Your physician recommends that you schedule a follow-up appointment next available with our [facility].  Your physician wants you to follow-up in: 1 year with Dr. Burt Knack.  You will receive a reminder letter in the mail two months in advance. If you don't receive a letter, please call our office to schedule the follow-up appointment.   Any Other Special Instructions Will Be Listed Below (If Applicable).  Please monitor your blood pressure 1-2 times per week.  Take this about 2 hours after your medications.  Please contact the office if your blood pressure is consistently 140/90 or higher.    Low-Sodium Eating Plan Sodium, which is an element that makes up salt, helps you maintain a healthy balance of fluids in your body. Too much sodium can increase your blood pressure and cause fluid and waste to be held in your body. Your health care provider or dietitian may recommend following this plan if you have high blood pressure (hypertension), kidney disease, liver disease, or heart failure. Eating less sodium can help lower your blood pressure, reduce swelling, and protect your heart, liver, and kidneys. What are tips for following this plan? General guidelines  Most people on this plan should limit their sodium intake to 1,500-2,000 mg (milligrams) of sodium each day. Reading food labels  The Nutrition Facts label lists the amount of sodium in one serving of the food. If you eat more than one serving, you must multiply the listed amount of sodium by the number of servings.  Choose foods with less than 140 mg of sodium per serving.  Avoid foods with 300 mg of sodium or more per serving. Shopping  Look for lower-sodium products, often labeled as "low-sodium" or "no salt added."  Always check  the sodium content even if foods are labeled as "unsalted" or "no salt added".  Buy fresh foods. ? Avoid canned foods and premade or frozen meals. ? Avoid canned, cured, or processed meats  Buy breads that have less than 80 mg of sodium per slice. Cooking  Eat more home-cooked food and less restaurant, buffet, and fast food.  Avoid adding salt when cooking. Use salt-free seasonings or herbs instead of table salt or sea salt. Check with your health care provider or pharmacist before using salt substitutes.  Cook with plant-based oils, such as canola, sunflower, or olive oil. Meal planning  When eating at a restaurant, ask that your food be prepared with less salt or no salt, if possible.  Avoid foods that contain MSG (monosodium glutamate). MSG is sometimes added to [place] food, bouillon, and some canned foods. What foods are recommended? The items listed may not be a complete list. Talk with your dietitian about what dietary choices are best for you. Grains Low-sodium cereals, including oats, puffed wheat and rice, and shredded wheat. Low-sodium crackers. Unsalted rice. Unsalted pasta. Low-sodium bread. Whole-grain breads and whole-grain pasta. Vegetables Fresh or frozen vegetables. "No salt added" canned vegetables. "No salt added" tomato sauce and paste. Low-sodium or reduced-sodium tomato and vegetable juice. Fruits Fresh, frozen, or canned fruit. Fruit juice. Meats and other protein foods Fresh or frozen (no salt added) meat, poultry, seafood, and fish. Low-sodium canned tuna and salmon. Unsalted nuts. Dried peas, beans, and lentils without added salt. Unsalted canned beans. Eggs. Unsalted nut butters. Dairy Milk. Soy milk. Cheese that is  naturally low in sodium, such as ricotta cheese, fresh mozzarella, or Swiss cheese Low-sodium or reduced-sodium cheese. Cream cheese. Yogurt. Fats and oils Unsalted butter. Unsalted margarine with no trans fat. Vegetable oils such as canola or  olive oils. Seasonings and other foods Fresh and dried herbs and spices. Salt-free seasonings. Low-sodium mustard and ketchup. Sodium-free salad dressing. Sodium-free light mayonnaise. Fresh or refrigerated horseradish. Lemon juice. Vinegar. Homemade, reduced-sodium, or low-sodium soups. Unsalted popcorn and pretzels. Low-salt or salt-free chips. What foods are not recommended? The items listed may not be a complete list. Talk with your dietitian about what dietary choices are best for you. Grains Instant hot cereals. Bread stuffing, pancake, and biscuit mixes. Croutons. Seasoned rice or pasta mixes. Noodle soup cups. Boxed or frozen macaroni and cheese. Regular salted crackers. Self-rising flour. Vegetables Sauerkraut, pickled vegetables, and relishes. Olives. [place] fries. Onion rings. Regular canned vegetables (not low-sodium or reduced-sodium). Regular canned tomato sauce and paste (not low-sodium or reduced-sodium). Regular tomato and vegetable juice (not low-sodium or reduced-sodium). Frozen vegetables in sauces. Meats and other protein foods Meat or fish that is salted, canned, smoked, spiced, or pickled. Bacon, ham, sausage, hotdogs, corned beef, chipped beef, packaged lunch meats, salt pork, jerky, pickled herring, anchovies, regular canned tuna, sardines, salted nuts. Dairy Processed cheese and cheese spreads. Cheese curds. Blue cheese. Feta cheese. String cheese. Regular cottage cheese. Buttermilk. Canned milk. Fats and oils Salted butter. Regular margarine. Ghee. Bacon fat. Seasonings and other foods Onion salt, garlic salt, seasoned salt, table salt, and sea salt. Canned and packaged gravies. Worcestershire sauce. Tartar sauce. Barbecue sauce. Teriyaki sauce. Soy sauce, including reduced-sodium. Steak sauce. Fish sauce. Oyster sauce. Cocktail sauce. Horseradish that you find on the shelf. Regular ketchup and mustard. Meat flavorings and tenderizers. Bouillon cubes. Hot sauce and Tabasco  sauce. Premade or packaged marinades. Premade or packaged taco seasonings. Relishes. Regular salad dressings. Salsa. Potato and tortilla chips. Corn chips and puffs. Salted popcorn and pretzels. Canned or dried soups. Pizza. Frozen entrees and pot pies. Summary  Eating less sodium can help lower your blood pressure, reduce swelling, and protect your heart, liver, and kidneys.  Most people on this plan should limit their sodium intake to 1,500-2,000 mg (milligrams) of sodium each day.  Canned, boxed, and frozen foods are high in sodium. Restaurant foods, fast foods, and pizza are also very high in sodium. You also get sodium by adding salt to food.  Try to cook at home, eat more fresh fruits and vegetables, and eat less fast food, canned, processed, or prepared foods. This information is not intended to replace advice given to you by your health care provider. Make sure you discuss any questions you have with your health care provider. Document Released: 06/05/2002 Document Revised: 12/07/2016 Document Reviewed: 12/07/2016 Elsevier Interactive Patient Education  [employer].   If you need a refill on your cardiac medications before your next appointment, please call your pharmacy.

## 2018-04-18 ENCOUNTER — Other Ambulatory Visit: Payer: Managed Care, Other (non HMO) | Admitting: *Deleted

## 2018-04-18 DIAGNOSIS — I10 Essential (primary) hypertension: Secondary | ICD-10-CM

## 2018-04-18 LAB — BASIC METABOLIC PANEL
BUN/Creatinine Ratio: 22 (ref 10–24)
BUN: 20 mg/dL (ref 8–27)
CALCIUM: 9.2 mg/dL (ref 8.6–10.2)
CO2: 24 mmol/L (ref 20–29)
Chloride: 101 mmol/L (ref 96–106)
Creatinine, Ser: 0.89 mg/dL (ref 0.76–1.27)
GFR calc Af Amer: 105 mL/min/{1.73_m2} (ref >59)
GFR calc non Af Amer: 91 mL/min/{1.73_m2} (ref >59)
GLUCOSE: 108 mg/dL — AB (ref 65–99)
POTASSIUM: 3.8 mmol/L (ref 3.5–5.2)
SODIUM: 140 mmol/L (ref 134–144)

## 2018-05-07 ENCOUNTER — Other Ambulatory Visit: Payer: Self-pay | Admitting: Cardiovascular Disease

## 2018-05-07 DIAGNOSIS — I2510 Atherosclerotic heart disease of native coronary artery without angina pectoris: Secondary | ICD-10-CM

## 2018-05-07 DIAGNOSIS — I10 Essential (primary) hypertension: Secondary | ICD-10-CM

## 2018-05-07 DIAGNOSIS — E785 Hyperlipidemia, unspecified: Secondary | ICD-10-CM

## 2018-05-09 ENCOUNTER — Ambulatory Visit: Payer: Managed Care, Other (non HMO)

## 2018-07-15 ENCOUNTER — Other Ambulatory Visit: Payer: Self-pay | Admitting: Cardiovascular Disease

## 2018-07-15 DIAGNOSIS — I2510 Atherosclerotic heart disease of native coronary artery without angina pectoris: Secondary | ICD-10-CM

## 2018-07-15 DIAGNOSIS — E785 Hyperlipidemia, unspecified: Secondary | ICD-10-CM

## 2018-07-15 DIAGNOSIS — I10 Essential (primary) hypertension: Secondary | ICD-10-CM

## 2018-07-25 ENCOUNTER — Other Ambulatory Visit: Payer: Self-pay | Admitting: Cardiovascular Disease

## 2019-01-06 ENCOUNTER — Encounter: Payer: Self-pay | Admitting: Internal Medicine

## 2019-01-06 ENCOUNTER — Ambulatory Visit (INDEPENDENT_AMBULATORY_CARE_PROVIDER_SITE_OTHER): Payer: Managed Care, Other (non HMO) | Admitting: Internal Medicine

## 2019-01-06 VITALS — BP 138/88 | HR 79 | Temp 98.2°F | Ht 65.4 in | Wt 178.0 lb

## 2019-01-06 DIAGNOSIS — Z23 Encounter for immunization: Secondary | ICD-10-CM

## 2019-01-06 DIAGNOSIS — L570 Actinic keratosis: Secondary | ICD-10-CM | POA: Diagnosis not present

## 2019-01-06 DIAGNOSIS — M25562 Pain in left knee: Secondary | ICD-10-CM | POA: Diagnosis not present

## 2019-01-06 DIAGNOSIS — I10 Essential (primary) hypertension: Secondary | ICD-10-CM

## 2019-01-06 DIAGNOSIS — Z0001 Encounter for general adult medical examination with abnormal findings: Secondary | ICD-10-CM

## 2019-01-06 DIAGNOSIS — I2510 Atherosclerotic heart disease of native coronary artery without angina pectoris: Secondary | ICD-10-CM

## 2019-01-06 DIAGNOSIS — Z0000 Encounter for general adult medical examination without abnormal findings: Secondary | ICD-10-CM

## 2019-01-06 LAB — CBC
HCT: 41.7 % (ref 39.0–52.0)
Hemoglobin: 14.6 g/dL (ref 13.0–17.0)
MCHC: 35.1 g/dL (ref 30.0–36.0)
MCV: 84.4 fl (ref 78.0–100.0)
Platelets: 332 10*3/uL (ref 150.0–400.0)
RBC: 4.94 Mil/uL (ref 4.22–5.81)
RDW: 13.3 % (ref 11.5–15.5)
WBC: 9.3 10*3/uL (ref 4.0–10.5)

## 2019-01-06 LAB — COMPREHENSIVE METABOLIC PANEL
ALT: 37 U/L (ref 0–53)
AST: 20 U/L (ref 0–37)
Albumin: 4.4 g/dL (ref 3.5–5.2)
Alkaline Phosphatase: 56 U/L (ref 39–117)
BILIRUBIN TOTAL: 0.5 mg/dL (ref 0.2–1.2)
BUN: 19 mg/dL (ref 6–23)
CO2: 29 mEq/L (ref 19–32)
CREATININE: 0.88 mg/dL (ref 0.40–1.50)
Calcium: 9.8 mg/dL (ref 8.4–10.5)
Chloride: 100 mEq/L (ref 96–112)
GFR: 92.68 mL/min (ref >60.00)
Glucose, Bld: 106 mg/dL — ABNORMAL HIGH (ref 70–99)
Potassium: 3.8 mEq/L (ref 3.5–5.1)
Sodium: 138 mEq/L (ref 135–145)
Total Protein: 7.4 g/dL (ref 6.0–8.3)

## 2019-01-06 LAB — LIPID PANEL
Cholesterol: 143 mg/dL (ref 0–200)
HDL: 31.6 mg/dL — ABNORMAL LOW (ref >39.00)
NonHDL: 111.37
Total CHOL/HDL Ratio: 5
Triglycerides: 240 mg/dL — ABNORMAL HIGH (ref 0.0–149.0)
VLDL: 48 mg/dL — ABNORMAL HIGH (ref 0.0–40.0)

## 2019-01-06 LAB — LDL CHOLESTEROL, DIRECT: Direct LDL: 96 mg/dL

## 2019-01-06 NOTE — Assessment & Plan Note (Signed)
Discussed fitness Yearly flu vaccine Tetanus booster Colon due 2021 Defer PSA till at least next year

## 2019-01-06 NOTE — Assessment & Plan Note (Signed)
Quiet On meds

## 2019-01-06 NOTE — Assessment & Plan Note (Signed)
Discussed options Verbal consent Liquid nitrogen 30 seconds x 2  Tolerated well Discussed home care 

## 2019-01-06 NOTE — Assessment & Plan Note (Signed)
Not clearly arthritic Will have Dr Lorelei Pont evaluate

## 2019-01-06 NOTE — Addendum Note (Signed)
Addended by: Pilar Grammes on: 01/06/2019 10:31 AM   Modules accepted: Orders

## 2019-01-06 NOTE — Progress Notes (Signed)
Subjective:    Patient ID: [name], male    DOB: [birth year], 63 y.o.   MRN: [id number]  HPI Here for physical  Has been monitoring BP Usually takes a long time to settle himself down 144/88  Had recent cold Lingered for 2 weeks but finally getting better  Has spot on left neck he wants checked ?skin tag that he shaves off at times  Ongoing pain along lateral left calf---"locks up" after up on feet all day Heat/rare ibuprofen helps No swelling No knee pain  Active at work but not much exercise otherwise Limited with golf---doesn't walk the course  Current Outpatient Medications on File Prior to Visit  Medication Sig Dispense Refill  . amLODipine (NORVASC) 5 MG tablet TAKE 1 TABLET (5 MG TOTAL) BY MOUTH DAILY. 30 tablet 10  . aspirin 81 MG tablet Take 81 mg by mouth daily.      . carvedilol (COREG) 6.25 MG tablet TAKE 1 TABLET (6.25 MG TOTAL) BY MOUTH 2 (TWO) TIMES DAILY WITH A MEAL. 180 tablet 2  . fish oil-omega-3 fatty acids 1000 MG capsule Take 2 g by mouth daily.      . hydrochlorothiazide (HYDRODIURIL) 25 MG tablet Take 1 tablet (25 mg total) by mouth daily. 90 tablet 3  . OVER THE COUNTER MEDICATION Omega Q plus Resveratrol and Turmeric    . ramipril (ALTACE) 10 MG capsule TAKE 1 CAPSULE BY MOUTH TWICE A DAY 180 capsule 2  . rosuvastatin (CRESTOR) 5 MG tablet TAKE ONE TABLET BY MOUTH ON MONDAY, WEDNESDAY AND FRIDAY 15 tablet 11   No current facility-administered medications on file prior to visit.     Allergies  Allergen Reactions  . Atorvastatin     REACTION: myalgias  . Tribenzor [Olmesartan-Amlodipine-Hctz] Other (See Comments)    Joint pain    Past Medical History:  Diagnosis Date  . CAD (coronary artery disease)    post stent X 3 to LAD  . Coronary atherosclerosis of native coronary artery 10/08/2007   Qualifier: Diagnosis of  By: Danelle Earthly CMA, Darlene    . Dyslipidemia   . Essential hypertension 10/07/2007   Qualifier: Diagnosis of  By: Wynona Luna   . HEMOCCULT POSITIVE STOOL 06/17/2010   Qualifier: Diagnosis of  By: Wynona Luna   . Hyperglycemia 03/20/2010   Qualifier: Diagnosis of  By: Wynona Luna   . Hyperlipidemia 10/08/2007   Qualifier: Diagnosis of  By: Danelle Earthly CMA, Darlene    . Hypertension   . Osteoarthritis of both knees 07/16/2008   Qualifier: Diagnosis of  By: Wynona Luna   . Unspecified disorder of prostate 07/29/2010   Qualifier: Diagnosis of  By: Wynona Luna     Past Surgical History:  Procedure Laterality Date  . CORONARY STENT PLACEMENT  2002  . EYE MUSCLE SURGERY      Family History  Problem Relation Age of Onset  . Coronary artery disease Mother   . Heart attack Mother        early 50's  . Diabetes Mother   . Hypertension Mother   . Cancer Father        lung     Social History   Socioeconomic History  . Marital status: Married    Spouse name: Not on file  . Number of children: 3  . Years of education: Not on file  . Highest education level: Not on file  Occupational History  . Occupation:  Dealer for air compressors    Employer: [employer]    Comment: works mostly at [employer]  . Financial resource strain: Not on file  . Food insecurity:    Worry: Not on file    Inability: Not on file  . Transportation needs:    Medical: Not on file    Non-medical: Not on file  Tobacco Use  . Smoking status: Former Smoker    Start date: 01/29/1974    Last attempt to quit: 12/28/1978    Years since quitting: 40.0  . Smokeless tobacco: Former Systems developer    Quit date: 01/30/1984  Substance and Sexual Activity  . Alcohol use: No  . Drug use: No  . Sexual activity: Not on file  Lifestyle  . Physical activity:    Days per week: Not on file    Minutes per session: Not on file  . Stress: Not on file  Relationships  . Social connections:    Talks on phone: Not on file    Gets together: Not on file    Attends religious service: Not on file     Active member of club or organization: Not on file    Attends meetings of clubs or organizations: Not on file    Relationship status: Not on file  . Intimate partner violence:    Fear of current or ex partner: Not on file    Emotionally abused: Not on file    Physically abused: Not on file    Forced sexual activity: Not on file  Other Topics Concern  . Not on file  Social History Narrative   Divorced and remarried 1990's   1 son from first marriage, 2 from second   Review of Systems  Constitutional: Negative for fatigue and unexpected weight change.       Wears seat belt  HENT: Negative for dental problem, hearing loss, tinnitus and trouble swallowing.        Overdue for dentist  Eyes: Negative for visual disturbance.       No diplopia or unilateral vision loss  Respiratory: Negative for chest tightness and shortness of breath.        Cough only with the cold  Cardiovascular: Negative for chest pain, palpitations and leg swelling.  Gastrointestinal: Negative for abdominal pain, blood in stool and constipation.       No heartburn  Endocrine: Negative for polydipsia and polyuria.  Genitourinary: Negative for difficulty urinating and urgency.       Some nocturia now   Musculoskeletal: Negative for back pain.       Fingers stiff at the end of the day  Skin: Negative for rash.  Allergic/Immunologic: Negative for environmental allergies and immunocompromised state.  Neurological: Negative for dizziness, syncope, light-headedness and headaches.  Hematological: Negative for adenopathy. Does not bruise/bleed easily.  Psychiatric/Behavioral: Negative for dysphoric mood and sleep disturbance. The patient is not nervous/anxious.        Objective:   Physical Exam  Constitutional: He is oriented to person, place, and time. He appears well-developed. No distress.  HENT:  Head: Normocephalic and atraumatic.  Right Ear: External ear normal.  Left Ear: External ear normal.  Mouth/Throat:  Oropharynx is clear and moist. No oropharyngeal exudate.  Eyes: Pupils are equal, round, and reactive to light. Conjunctivae are normal.  Neck: No thyromegaly present.  Cardiovascular: Normal rate, regular rhythm, normal heart sounds and intact distal pulses. Exam reveals no gallop.  No murmur heard. Respiratory:  Effort normal and breath sounds normal. No respiratory distress. He has no wheezes. He has no rales.  GI: Soft. He exhibits no distension. There is no abdominal tenderness. There is no rebound and no guarding.  Musculoskeletal:        General: No edema.     Comments: Left knee--no effusion Severe pain with full flexion  Lymphadenopathy:    He has no cervical adenopathy.  Neurological: He is alert and oriented to person, place, and time.  Skin: No rash noted.  13mm actinic left postauricular area--low Mild psoriatic plaque left knee  Psychiatric: He has a normal mood and affect. His behavior is normal.           Assessment & Plan:

## 2019-01-06 NOTE — Assessment & Plan Note (Signed)
BP Readings from Last 3 Encounters:  01/06/19 138/88  04/11/18 (!) 166/110  01/04/18 (!) 160/94   Better today Usually high at office

## 2019-01-21 ENCOUNTER — Other Ambulatory Visit: Payer: Self-pay | Admitting: Cardiovascular Disease

## 2019-01-22 NOTE — Progress Notes (Signed)
Dr. Frederico Hamman T. Janera Peugh, MD, [facility] and [facility] Medicine [place], [address] Phone: [phone] Fax: [phone]  01/23/2019  Patient: [name], MRN: [id number], DOB: 20-Apr-1956, 63 y.o.  Primary Physician:  Venia Carbon, MD   Chief Complaint  Patient presents with  . Knee Pain    Left    Subjective:   [name] is a 63 y.o. very pleasant male patient who presents with the following:  63 yo male Body mass index is 29.88 kg/m. who presents with L sided knee pain referred by Dr. Silvio Pate.   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 Does not have to liftt too much heavy and on knees a lot. Really the whole leg. Some lateral pain.   Both legs tired now with playing golf. Does do a lot of walking and uses a lot of stairs. Goes up and down stairs.   He has a generalized sensation of weakness in his left leg.  Is not any trauma, accident, prior fractures, or prior operative intervention.  He does not take NSAIDS due to cards recs and on asa.   95 deg flexion  Past Medical History, Surgical History, Social History, Family History, Problem List, Medications, and Allergies have been reviewed and updated if relevant.  Patient Active Problem List   Diagnosis Date Noted  . Left knee pain 01/06/2019  . Actinic keratosis 01/06/2019  . Preventative health care 01/04/2018  . Osteoarthritis of both knees 07/16/2008  . Hyperlipidemia 10/08/2007  . Coronary atherosclerosis of native coronary artery 10/08/2007  . Essential hypertension 10/07/2007    Past Medical History:  Diagnosis Date  . CAD (coronary artery disease)    post stent X 3 to LAD  . Coronary atherosclerosis of native coronary artery 10/08/2007   Qualifier: Diagnosis of  By: Danelle Earthly CMA, Darlene    . Dyslipidemia   . Essential hypertension 10/07/2007   Qualifier: Diagnosis of  By: Wynona Luna   .  HEMOCCULT POSITIVE STOOL 06/17/2010   Qualifier: Diagnosis of  By: Wynona Luna   . Hyperglycemia 03/20/2010   Qualifier: Diagnosis of  By: Wynona Luna   . Hyperlipidemia 10/08/2007   Qualifier: Diagnosis of  By: Danelle Earthly CMA, Darlene    . Hypertension   . Osteoarthritis of both knees 07/16/2008   Qualifier: Diagnosis of  By: Wynona Luna   . Unspecified disorder of prostate 07/29/2010   Qualifier: Diagnosis of  By: Wynona Luna     Past Surgical History:  Procedure Laterality Date  . CORONARY STENT PLACEMENT  2002  . EYE MUSCLE SURGERY      Social History   Socioeconomic History  . Marital status: Married    Spouse name: Not on file  . Number of children: 3  . Years of education: Not on file  . Highest education level: Not on file  Occupational History  . 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 Financial resource strain: Not on file  . Food insecurity:    Worry: Not on file    Inability: Not on file  . Transportation needs:    Medical: Not on file    Non-medical: Not on file  Tobacco Use  . Smoking status: Former Smoker  Start date: 01/29/1974    Last attempt to quit: 12/28/1978    Years since quitting: 40.0  . Smokeless tobacco: Former Systems developer    Quit date: 01/30/1984  Substance and Sexual Activity  . Alcohol use: No  . Drug use: No  . Sexual activity: Not on file  Lifestyle  . Physical activity:    Days per week: Not on file    Minutes per session: Not on file  . Stress: Not on file  Relationships  . Social connections:    Talks on phone: Not on file    Gets together: Not on file    Attends religious service: Not on file    Active member of club or organization: Not on file    Attends meetings of clubs or organizations: Not on file    Relationship status: Not on file  . Intimate partner violence:    Fear of current or ex partner: Not on file     Emotionally abused: Not on file    Physically abused: Not on file    Forced sexual activity: Not on file  Other Topics Concern  . Not on file  Social History Narrative   Divorced and remarried 1990's   1 son from first marriage, 2 from second    Family History  Problem Relation Age of Onset  . Coronary artery disease Mother   . Heart attack Mother        early 44's  . Diabetes Mother   . Hypertension Mother   . Cancer Father        lung     Allergies  Allergen Reactions  . Atorvastatin     REACTION: myalgias  . Tribenzor [Olmesartan-Amlodipine-Hctz] Other (See Comments)    Joint pain    Medication list reviewed and updated in full in [facility].  GEN: No fevers, chills. Nontoxic. Primarily MSK c/o today. MSK: Detailed in the HPI GI: tolerating PO intake without difficulty Neuro: No numbness, parasthesias, or tingling associated. Otherwise the pertinent positives of the ROS are noted above.   Objective:   BP (!) 170/90   Pulse 71   Temp 98.7 F (37.1 C) (Oral)   Ht 5' 5.4" (1.661 m)   Wt 181 lb 12 oz (82.4 kg)   BMI 29.88 kg/m    GEN: WDWN, NAD, Non-toxic, Alert & Oriented x 3 HEENT: Atraumatic, Normocephalic.  Ears and Nose: No external deformity. EXTR: No clubbing/cyanosis/edema NEURO: Normal gait.  PSYCH: Normally interactive. Conversant. Not depressed or anxious appearing.  Calm demeanor.    Left knee: Mild effusion.  No significant pain at patellofemoral joint.  No significant joint line pain medially or laterally.  Full extension, flexion to 95 degrees.  This compares to the right knee with flexion of 125 degrees.  Both McMurray's and flexion pinch causes pain.  Bounce home is negative.  Nontender at the patella tendon as well as the quad tendon.  str at knee and hip 5/5 throughout  Radiology: Dg Knee 4 Views W/patella Left  Result Date: 01/23/2019 CLINICAL DATA:  Left knee pain. Evaluate for osteoarthritis. Initial encounter. EXAM: LEFT  KNEE - COMPLETE 4+ VIEW COMPARISON:  None. FINDINGS: Four view left knee: No fracture or dislocation. Mild to moderate medial tibiofemoral joint degenerative changes. Probable subchondral cystic changes below tibial spine region. Mild patellofemoral joint degenerative changes. Minimal lateral tibiofemoral joint degenerative changes. Very small suprapatellar joint effusion. Single view right knee: 1.2 cm nonspecific lesion proximal tibia may represent subchondral cyst related  to degenerative changes. IMPRESSION: Four view left knee: 1. Mild to moderate medial tibiofemoral joint degenerative changes. 2. Probable subchondral cystic changes inferior to tibial spine region. 3. Mild patellofemoral joint degenerative changes. 4. Minimal lateral tibiofemoral joint degenerative changes. 5. Very small suprapatellar joint effusion. Single view right knee: 1.2 cm nonspecific lesion proximal tibia may represent subchondral cyst related to degenerative changes. Electronically Signed   By: Genia Del M.D.   On: 01/23/2019 10:45     Assessment and Plan:   Osteoarthrosis, localized, primary, knee, left - Plan: methylPREDNISolone acetate (DEPO-MEDROL) injection 80 mg  Left knee pain, unspecified chronicity - Plan: DG Knee 4 Views W/Patella Left, DG Knee 4 Views W/Patella Left  He does have some significant radiographical arthritic changes on his left knee, and he has a loss of motion with 95 degrees of flexion only in the left knee.  You cannot have a functional left knee or left lower extremity with this degree of motion loss.  This may only be from osteoarthritic changes, or he may have slowly developed loss of motion from his joint capsule.  I am going to have him do an aggressive range of motion protocol and follow-up with me in about 6 to 8 weeks.  Inject his left knee now for pain relief and to calm down inflammation acutely, followed by range of motion work.  Knee Injection, L Date of procedure:  01/23/2019 Patient verbally consented to procedure. Risks (including potential rare risk of infection), benefits, and alternatives explained. Sterilely prepped with Chloraprep. Ethyl cholride used for anesthesia. 8 cc Lidocaine 1% mixed with 2 mL Depo-Medrol 40 mg injected using the anteromedial approach without difficulty. No complications with procedure and tolerated well. Patient had decreased pain post-injection. Medication: 2 mL of Depo-Medrol 40 mg, equaling Depo-Medrol 80 mg total   Follow-up: Return in about 7 weeks (around 03/13/2019).  Meds ordered this encounter  Medications  . methylPREDNISolone acetate (DEPO-MEDROL) injection 80 mg   Orders Placed This Encounter  Procedures  . DG Knee 4 Views W/Patella Left    Signed,  Jashley Yellin T. Oneill Bais, MD   Outpatient Encounter Medications as of 01/23/2019  Medication Sig  . amLODipine (NORVASC) 5 MG tablet TAKE 1 TABLET (5 MG TOTAL) BY MOUTH DAILY.  Marland Kitchen aspirin 81 MG tablet Take 81 mg by mouth daily.    . carvedilol (COREG) 6.25 MG tablet TAKE 1 TABLET (6.25 MG TOTAL) BY MOUTH 2 (TWO) TIMES DAILY WITH A MEAL.  . fish oil-omega-3 fatty acids 1000 MG capsule Take 2 g by mouth daily.    . hydrochlorothiazide (HYDRODIURIL) 25 MG tablet Take 1 tablet (25 mg total) by mouth daily.  Marland Kitchen OVER THE COUNTER MEDICATION Omega Q plus Resveratrol and Turmeric  . ramipril (ALTACE) 10 MG capsule TAKE 1 CAPSULE BY MOUTH TWICE A DAY  . rosuvastatin (CRESTOR) 5 MG tablet TAKE ONE TABLET BY MOUTH ON MONDAY, WEDNESDAY AND FRIDAY  . [EXPIRED] methylPREDNISolone acetate (DEPO-MEDROL) injection 80 mg    No facility-administered encounter medications on file as of 01/23/2019.

## 2019-01-23 ENCOUNTER — Encounter: Payer: Self-pay | Admitting: Family Medicine

## 2019-01-23 ENCOUNTER — Ambulatory Visit (INDEPENDENT_AMBULATORY_CARE_PROVIDER_SITE_OTHER)
Admission: RE | Admit: 2019-01-23 | Discharge: 2019-01-23 | Disposition: A | Discharge status: Home / Self Care | Payer: Managed Care, Other (non HMO) | Source: Ambulatory Visit | Attending: Family Medicine | Admitting: Family Medicine

## 2019-01-23 ENCOUNTER — Ambulatory Visit: Payer: Managed Care, Other (non HMO) | Admitting: Family Medicine

## 2019-01-23 VITALS — BP 170/90 | HR 71 | Temp 98.7°F | Ht 65.4 in | Wt 181.8 lb

## 2019-01-23 DIAGNOSIS — M25562 Pain in left knee: Secondary | ICD-10-CM

## 2019-01-23 DIAGNOSIS — M1712 Unilateral primary osteoarthritis, left knee: Secondary | ICD-10-CM | POA: Diagnosis not present

## 2019-01-23 MED ORDER — METHYLPREDNISOLONE ACETATE 40 MG/ML IJ SUSP
80.0000 mg | Freq: Once | INTRAMUSCULAR | Status: AC
  Administered 2019-01-23: 80 mg via INTRA_ARTICULAR

## 2019-02-07 ENCOUNTER — Other Ambulatory Visit: Payer: Self-pay | Admitting: Cardiovascular Disease

## 2019-02-07 DIAGNOSIS — E785 Hyperlipidemia, unspecified: Secondary | ICD-10-CM

## 2019-02-07 DIAGNOSIS — I2510 Atherosclerotic heart disease of native coronary artery without angina pectoris: Secondary | ICD-10-CM

## 2019-02-07 DIAGNOSIS — I10 Essential (primary) hypertension: Secondary | ICD-10-CM

## 2019-03-12 NOTE — Progress Notes (Deleted)
Vincent Elahi T. Vincent Betts, MD Primary Care and [place] at [facility] [name], [id number] [phone]  FAX: Betances - 63 y.o. male  MRN [id number]  Date of Birth: 1956/09/16  Visit Date: 03/15/2019  PCP: Vincent Carbon, MD  Referred by: Vincent Carbon, MD  No chief complaint on file.  Subjective:   [name] is a 63 y.o. very pleasant male patient who presents with the following:  At that point, his L knee was fairly impairing from pain standpoint and he was only able to flex his knee to 95 degrees.  I injected his knee with Depo-Medrol and starting him on an aggressive ROM protocol.    Past Medical History, Surgical History, Social History, Family History, Problem List, Medications, and Allergies have been reviewed and updated if relevant.  Patient Active Problem List   Diagnosis Date Noted  . Left knee pain 01/06/2019  . Actinic keratosis 01/06/2019  . Preventative health care 01/04/2018  . Osteoarthritis of both knees 07/16/2008  . Hyperlipidemia 10/08/2007  . Coronary atherosclerosis of native coronary artery 10/08/2007  . Essential hypertension 10/07/2007    Past Medical History:  Diagnosis Date  . CAD (coronary artery disease)    post stent X 3 to LAD  . Coronary atherosclerosis of native coronary artery 10/08/2007   Qualifier: Diagnosis of  By: Danelle Earthly CMA, Darlene    . Dyslipidemia   . Essential hypertension 10/07/2007   Qualifier: Diagnosis of  By: Wynona Luna   . HEMOCCULT POSITIVE STOOL 06/17/2010   Qualifier: Diagnosis of  By: Wynona Luna   . Hyperglycemia 03/20/2010   Qualifier: Diagnosis of  By: Wynona Luna   . Hyperlipidemia 10/08/2007   Qualifier: Diagnosis of  By: Danelle Earthly CMA, Darlene    . Hypertension   . Osteoarthritis of both knees 07/16/2008   Qualifier: Diagnosis of  By: Wynona Luna   . Unspecified disorder of prostate 07/29/2010   Qualifier: Diagnosis of  By: Wynona Luna     Past Surgical History:  Procedure Laterality Date  . CORONARY STENT PLACEMENT  2002  . EYE MUSCLE SURGERY      Social History   Socioeconomic History  . Marital status: Married    Spouse name: Not on file  . Number of children: 3  . Years of education: Not on file  . Highest education level: Not on file  Occupational History  . 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 Financial resource strain: Not on file  . Food insecurity:    Worry: Not on file    Inability: Not on file  . Transportation needs:    Medical: Not on file    Non-medical: Not on file  Tobacco Use  . Smoking status: Former Smoker    Start date: 01/29/1974    Last attempt to quit: 12/28/1978    Years since quitting: 40.2  . Smokeless tobacco: Former Systems developer    Quit date: 01/30/1984  Substance and Sexual Activity  . Alcohol use: No  . Drug use: No  . Sexual activity: Not on file  Lifestyle  . Physical activity:    Days per week: Not on file    Minutes per session: Not on file  . Stress: Not on file  Relationships  .  Social connections:    Talks on phone: Not on file    Gets together: Not on file    Attends religious service: Not on file    Active member of club or organization: Not on file    Attends meetings of clubs or organizations: Not on file    Relationship status: Not on file  . Intimate partner violence:    Fear of current or ex partner: Not on file    Emotionally abused: Not on file    Physically abused: Not on file    Forced sexual activity: Not on file  Other Topics Concern  . Not on file  Social History Narrative   Divorced and remarried 1990's   1 son from first marriage, 2 from second    Family History  Problem Relation Age of Onset  . Coronary artery disease Mother   . Heart attack Mother        early 79's  . Diabetes Mother   .  Hypertension Mother   . Cancer Father        lung     Allergies  Allergen Reactions  . Atorvastatin     REACTION: myalgias  . Tribenzor [Olmesartan-Amlodipine-Hctz] Other (See Comments)    Joint pain    Medication list reviewed and updated in full in [facility].  GEN: No fevers, chills. Nontoxic. Primarily MSK c/o today. MSK: Detailed in the HPI GI: tolerating PO intake without difficulty Neuro: No numbness, parasthesias, or tingling associated. Otherwise the pertinent positives of the ROS are noted above.   Objective:   There were no vitals taken for this visit.  ***  Radiology: Dg Knee 4 Views W/patella Left  Result Date: 01/23/2019 CLINICAL DATA:  Left knee pain. Evaluate for osteoarthritis. Initial encounter. EXAM: LEFT KNEE - COMPLETE 4+ VIEW COMPARISON:  None. FINDINGS: Four view left knee: No fracture or dislocation. Mild to moderate medial tibiofemoral joint degenerative changes. Probable subchondral cystic changes below tibial spine region. Mild patellofemoral joint degenerative changes. Minimal lateral tibiofemoral joint degenerative changes. Very small suprapatellar joint effusion. Single view right knee: 1.2 cm nonspecific lesion proximal tibia may represent subchondral cyst related to degenerative changes. IMPRESSION: Four view left knee: 1. Mild to moderate medial tibiofemoral joint degenerative changes. 2. Probable subchondral cystic changes inferior to tibial spine region. 3. Mild patellofemoral joint degenerative changes. 4. Minimal lateral tibiofemoral joint degenerative changes. 5. Very small suprapatellar joint effusion. Single view right knee: 1.2 cm nonspecific lesion proximal tibia may represent subchondral cyst related to degenerative changes. Electronically Signed   By: Genia Del M.D.   On: 01/23/2019 10:45   Assessment and Plan:   ***

## 2019-03-13 ENCOUNTER — Ambulatory Visit: Payer: Managed Care, Other (non HMO) | Admitting: Family Medicine

## 2019-03-15 ENCOUNTER — Ambulatory Visit: Payer: Managed Care, Other (non HMO) | Admitting: Family Medicine

## 2019-04-06 ENCOUNTER — Other Ambulatory Visit: Payer: Self-pay | Admitting: Cardiovascular Disease

## 2019-04-12 ENCOUNTER — Telehealth: Payer: Self-pay | Admitting: Internal Medicine

## 2019-04-12 ENCOUNTER — Other Ambulatory Visit: Payer: Self-pay | Admitting: Cardiovascular Disease

## 2019-04-12 NOTE — Telephone Encounter (Signed)
Left message asking pt to call office please r/s appointment with dr copland June july

## 2019-04-17 ENCOUNTER — Ambulatory Visit: Payer: Managed Care, Other (non HMO) | Admitting: Family Medicine

## 2019-04-17 ENCOUNTER — Ambulatory Visit: Payer: Managed Care, Other (non HMO) | Admitting: Cardiovascular Disease

## 2019-04-21 ENCOUNTER — Other Ambulatory Visit: Payer: Self-pay | Admitting: Cardiovascular Disease

## 2019-04-21 DIAGNOSIS — E785 Hyperlipidemia, unspecified: Secondary | ICD-10-CM

## 2019-04-21 DIAGNOSIS — I10 Essential (primary) hypertension: Secondary | ICD-10-CM

## 2019-04-21 DIAGNOSIS — I2510 Atherosclerotic heart disease of native coronary artery without angina pectoris: Secondary | ICD-10-CM

## 2019-06-26 ENCOUNTER — Ambulatory Visit: Payer: Managed Care, Other (non HMO) | Admitting: Family Medicine

## 2019-08-07 ENCOUNTER — Ambulatory Visit: Payer: Managed Care, Other (non HMO) | Admitting: Family Medicine

## 2019-08-11 ENCOUNTER — Telehealth: Payer: Self-pay | Admitting: Cardiovascular Disease

## 2019-08-11 NOTE — Telephone Encounter (Addendum)
Patient has appt with Dr. Burt Knack on 8/17, he doesn't not want to come into office. He would like to know if he can change his in office visit to a virtual visit.   Please call after 12pm.

## 2019-08-14 ENCOUNTER — Encounter: Payer: Self-pay | Admitting: Cardiovascular Disease

## 2019-08-14 ENCOUNTER — Ambulatory Visit: Payer: Managed Care, Other (non HMO) | Admitting: Cardiovascular Disease

## 2019-08-14 ENCOUNTER — Other Ambulatory Visit: Payer: Self-pay

## 2019-08-14 ENCOUNTER — Encounter (INDEPENDENT_AMBULATORY_CARE_PROVIDER_SITE_OTHER): Payer: Self-pay

## 2019-08-14 VITALS — BP 170/100 | HR 61 | Ht 65.4 in | Wt 175.0 lb

## 2019-08-14 DIAGNOSIS — I2510 Atherosclerotic heart disease of native coronary artery without angina pectoris: Secondary | ICD-10-CM | POA: Diagnosis not present

## 2019-08-14 DIAGNOSIS — E782 Mixed hyperlipidemia: Secondary | ICD-10-CM | POA: Diagnosis not present

## 2019-08-14 DIAGNOSIS — E785 Hyperlipidemia, unspecified: Secondary | ICD-10-CM | POA: Diagnosis not present

## 2019-08-14 DIAGNOSIS — I10 Essential (primary) hypertension: Secondary | ICD-10-CM

## 2019-08-14 MED ORDER — AMLODIPINE BESYLATE 10 MG PO TABS: 10.0000 mg | ORAL_TABLET | Freq: Every day | ORAL | 3 refills | Status: DC

## 2019-08-14 NOTE — Progress Notes (Signed)
Cardiology Office Note:    Date:  08/14/2019   ID:  [name], DOB 08/19/1956, MRN [id number]  PCP:  Venia Carbon, MD  Cardiologist:  Sherren Mocha, MD  Electrophysiologist:  None   Referring MD: Venia Carbon, MD   Chief Complaint  Patient presents with  . Coronary Artery Disease    History of Present Illness:    [name] is a 63 y.o. male with a hx of coronary artery disease status post bare-metal stenting x3 in 2002.  Comorbid medical conditions include hypertension and hyperlipidemia.  He has a long history of whitecoat hypertension.  He has undergone 24-hour ambulatory blood pressure monitoring demonstrating good blood pressure control.  The patient has been doing relatively well.  He has had no recent problems with chest pain, chest pressure, shortness of breath, heart palpitations, orthopnea, or PND. He took his BP last night because he has a long hx of white coat HTN. BP last night was 150/86.  He is statin intolerant and takes Crestor on a limited basis.  He has not been interested in lipid clinic referral for consideration of additive therapy.  Past Medical History:  Diagnosis Date  . CAD (coronary artery disease)    post stent X 3 to LAD  . Coronary atherosclerosis of native coronary artery 10/08/2007   Qualifier: Diagnosis of  By: Danelle Earthly CMA, Darlene    . Dyslipidemia   . Essential hypertension 10/07/2007   Qualifier: Diagnosis of  By: Wynona Luna   . HEMOCCULT POSITIVE STOOL 06/17/2010   Qualifier: Diagnosis of  By: Wynona Luna   . Hyperglycemia 03/20/2010   Qualifier: Diagnosis of  By: Wynona Luna   . Hyperlipidemia 10/08/2007   Qualifier: Diagnosis of  By: Danelle Earthly CMA, Darlene    . Hypertension   . Osteoarthritis of both knees 07/16/2008   Qualifier: Diagnosis of  By: Wynona Luna   . Unspecified disorder of prostate 07/29/2010   Qualifier: Diagnosis of  By: Wynona Luna     Past Surgical History:  Procedure  Laterality Date  . CORONARY STENT PLACEMENT  2002  . EYE MUSCLE SURGERY      Current Medications: Current Meds  Medication Sig  . amLODipine (NORVASC) 10 MG tablet Take 1 tablet (10 mg total) by mouth daily.  Marland Kitchen aspirin 81 MG tablet Take 81 mg by mouth daily.    . carvedilol (COREG) 6.25 MG tablet TAKE 1 TABLET (6.25 MG TOTAL) BY MOUTH 2 (TWO) TIMES DAILY WITH A MEAL.  . fish oil-omega-3 fatty acids 1000 MG capsule Take 2 g by mouth daily.    . hydrochlorothiazide (HYDRODIURIL) 25 MG tablet TAKE 1 TABLET BY MOUTH EVERY DAY  . OVER THE COUNTER MEDICATION Omega Q plus Resveratrol and Turmeric  . ramipril (ALTACE) 10 MG capsule TAKE 1 CAPSULE BY MOUTH TWICE A DAY  . rosuvastatin (CRESTOR) 5 MG tablet PLEASE SEE ATTACHED FOR DETAILED DIRECTIONS  . [DISCONTINUED] amLODipine (NORVASC) 5 MG tablet TAKE 1 TABLET (5 MG TOTAL) BY MOUTH DAILY.     Allergies:   Atorvastatin and Tribenzor [olmesartan-amlodipine-hctz]   Social History   Socioeconomic History  . Marital status: Married    Spouse name: Not on file  . Number of children: 3  . Years of education: Not on file  . Highest education level: Not on file  Occupational History  . 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 Financial resource strain: Not on file  . Food insecurity    Worry: Not on file    Inability: Not on file  . Transportation needs    Medical: Not on file    Non-medical: Not on file  Tobacco Use  . Smoking status: Former Smoker    Start date: 01/29/1974    Quit date: 12/28/1978    Years since quitting: 40.6  . Smokeless tobacco: Former Systems developer    Quit date: 01/30/1984  Substance and Sexual Activity  . Alcohol use: No  . Drug use: No  . Sexual activity: Not on file  Lifestyle  . Physical activity    Days per week: Not on file    Minutes per session: Not on file  . Stress: Not on file  Relationships  . Social  Herbalist on phone: Not on file    Gets together: Not on file    Attends religious service: Not on file    Active member of club or organization: Not on file    Attends meetings of clubs or organizations: Not on file    Relationship status: Not on file  Other Topics Concern  . Not on file  Social History Narrative   Divorced and remarried 1990's   1 son from first marriage, 2 from second     Family History: The patient's family history includes Cancer in his father; Coronary artery disease in his mother; Diabetes in his mother; Heart attack in his mother; Hypertension in his mother.  ROS:   Please see the history of present illness.    All other systems reviewed and are negative.  EKGs/Labs/Other Studies Reviewed:    EKG:  EKG is ordered today.  The ekg ordered today demonstrates normal sinus rhythm 61 bpm, within normal limits.  Recent Labs: 01/06/2019: ALT 37; BUN 19; Creatinine, Ser 0.88; Hemoglobin 14.6; Platelets 332.0; Potassium 3.8; Sodium 138  Recent Lipid Panel    Component Value Date/Time   CHOL 143 01/06/2019 1035   CHOL 170 02/18/2017 0734   TRIG 240.0 (H) 01/06/2019 1035   TRIG 79 01/03/2007 0737   HDL 31.60 (L) 01/06/2019 1035   HDL 33 (L) 02/18/2017 0734   CHOLHDL 5 01/06/2019 1035   VLDL 48.0 (H) 01/06/2019 1035   LDLCALC 100 (H) 02/18/2017 0734   LDLDIRECT 96.0 01/06/2019 1035    Physical Exam:    VS:  BP (!) 170/100   Pulse 61   Ht 5' 5.4" (1.661 m)   Wt 175 lb (79.4 kg)   SpO2 97%   BMI 28.77 kg/m     Wt Readings from Last 3 Encounters:  08/14/19 175 lb (79.4 kg)  01/23/19 181 lb 12 oz (82.4 kg)  01/06/19 178 lb (80.7 kg)     GEN:  Well nourished, well developed in no acute distress HEENT: Normal NECK: No JVD; No carotid bruits LYMPHATICS: No lymphadenopathy CARDIAC: RRR, no murmurs, rubs, gallops RESPIRATORY:  Clear to auscultation without rales, wheezing or rhonchi  ABDOMEN: Soft, non-tender, non-distended  MUSCULOSKELETAL:  No edema; No deformity  SKIN: Warm and dry NEUROLOGIC:  Alert and oriented x 3 PSYCHIATRIC:  Normal affect   ASSESSMENT:    1. Atherosclerosis of native coronary artery of native heart without angina pectoris   2. Essential hypertension   3. Hyperlipidemia    PLAN:    In order of problems listed above:  1. The patient is stable  without symptoms of angina.  He will remain on aspirin for antiplatelet therapy, a low-dose of rosuvastatin as tolerated, and a beta-blocker. 2. I think blood pressure control is suboptimal.  He clearly has a component of marked whitecoat hypertension, but even his home readings are above goal.  I have asked him to increase amlodipine to 10 mg daily.  He will continue on current doses of carvedilol and hydrochlorothiazide as well as ramipril.  He will check blood pressure periodically and notify us if it remains elevated. 3. Most recent lipids are reviewed.  He has not been interested in lipid clinic referral for PCSK9 inhibitors.  We will continue current medication.  Last LDL cholesterol 96 mg/dL.   Medication Adjustments/Labs and Tests Ordered: Current medicines are reviewed at length with the patient today.  Concerns regarding medicines are outlined above.  Orders Placed This Encounter  Procedures  . EKG 12-Lead   Meds ordered this encounter  Medications  . amLODipine (NORVASC) 10 MG tablet    Sig: Take 1 tablet (10 mg total) by mouth daily.    Dispense:  90 tablet    Refill:  3    Patient Instructions  Medication Instructions:  1) INCREASE AMLODIPINE to 10 mg daily   Labwork: None  Testing/Procedures: None  Follow-Up: Your provider wants you to follow-up in: 1 year with Dr. Burt Knack. You will receive a reminder letter in the mail two months in advance. If you don't receive a letter, please call our office to schedule the follow-up appointment.      Signed, Sherren Mocha, MD  08/14/2019 12:44 PM    [place]

## 2019-08-14 NOTE — Patient Instructions (Signed)
Medication Instructions:  1) INCREASE AMLODIPINE to 10 mg daily   Labwork: None  Testing/Procedures: None  Follow-Up: Your provider wants you to follow-up in: 1 year with Dr. Burt Knack. You will receive a reminder letter in the mail two months in advance. If you don't receive a letter, please call our office to schedule the follow-up appointment.

## 2019-08-14 NOTE — Telephone Encounter (Signed)
Called to speak with patient as his appointment is scheduled at 1020 today. Offered to switch him to virtual visit, but he stated he will just come in the office.  Instructed him to call prior to appointment if he changes his mind.

## 2019-10-01 NOTE — Progress Notes (Signed)
Vincent Oaxaca T. Doral Digangi, MD Primary Care and IXL at [facility] [place], [address] Phone: [phone]  FAX: [phone]  [name] - 63 y.o. male  MRN [id number]  Date of Birth: 1956/05/23  Visit Date: 10/02/2019  PCP: Venia Carbon, MD  Referred by: Venia Carbon, MD  Chief Complaint  Patient presents with  . Follow-up    Left Knee   Subjective:   JUSTINIAN Terry is a 63 y.o. very pleasant male patient with Body mass index is 29.3 kg/m. who presents with the following:  Notable loss of motion to 95 degrees at last OV, injection of steroids and fairly aggressive ROM protocol. Started CBD in the morning.  Working a lot now.  He generally does not a bit better compared to the last time I saw him.  At that point I did do a knee injection on the left side and had him do some relatively aggressive range of motion.  He has had some modest improvement in his range of motion, but it still remains significantly decreased compared to what would be expected and compared to the other knee.  01/23/2019 Last OV with Owens Loffler, MD 63 yo male Body mass index is 29.88 kg/m. who presents with L sided knee pain referred by Dr. Silvio Pate.   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 Does not have to liftt too much heavy and on knees a lot. Really the whole leg. Some lateral pain.   Both legs tired now with playing golf. Does do a lot of walking and uses a lot of stairs. Goes up and down stairs.   He has a generalized sensation of weakness in his left leg.  Is not any trauma, accident, prior fractures, or prior operative intervention.  He does not take NSAIDS due to cards recs and on asa.   95 deg flexion  Past Medical History, Surgical History, Social History, Family History, Problem List, Medications, and Allergies have been reviewed and updated if relevant.   Patient Active Problem List   Diagnosis Date Noted  . Left knee pain 01/06/2019  . Actinic keratosis 01/06/2019  . Preventative health care 01/04/2018  . Osteoarthritis of both knees 07/16/2008  . Hyperlipidemia 10/08/2007  . Coronary atherosclerosis of native coronary artery 10/08/2007  . Essential hypertension 10/07/2007    Past Medical History:  Diagnosis Date  . CAD (coronary artery disease)    post stent X 3 to LAD  . Coronary atherosclerosis of native coronary artery 10/08/2007   Qualifier: Diagnosis of  By: Danelle Earthly CMA, Darlene    . Dyslipidemia   . Essential hypertension 10/07/2007   Qualifier: Diagnosis of  By: Wynona Luna   . HEMOCCULT POSITIVE STOOL 06/17/2010   Qualifier: Diagnosis of  By: Wynona Luna   . Hyperglycemia 03/20/2010   Qualifier: Diagnosis of  By: Wynona Luna   . Hyperlipidemia 10/08/2007   Qualifier: Diagnosis of  By: Danelle Earthly CMA, Darlene    . Hypertension   . Osteoarthritis of both knees 07/16/2008   Qualifier: Diagnosis of  By: Wynona Luna   . Unspecified disorder of prostate 07/29/2010   Qualifier: Diagnosis of  By: Wynona Luna     Past Surgical History:  Procedure Laterality Date  . CORONARY STENT PLACEMENT  2002  . EYE MUSCLE SURGERY  Social History   Socioeconomic History  . Marital status: Married    Spouse name: Not on file  . Number of children: 3  . Years of education: Not on file  . Highest education level: Not on file  Occupational History  . 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 Financial resource strain: Not on file  . Food insecurity    Worry: Not on file    Inability: Not on file  . Transportation needs    Medical: Not on file    Non-medical: Not on file  Tobacco Use  . Smoking status: Former Smoker    Start date: 01/29/1974    Quit date: 12/28/1978    Years since quitting: 40.7  .  Smokeless tobacco: Former Systems developer    Quit date: 01/30/1984  Substance and Sexual Activity  . Alcohol use: No  . Drug use: No  . Sexual activity: Not on file  Lifestyle  . Physical activity    Days per week: Not on file    Minutes per session: Not on file  . Stress: Not on file  Relationships  . Social Herbalist on phone: Not on file    Gets together: Not on file    Attends religious service: Not on file    Active member of club or organization: Not on file    Attends meetings of clubs or organizations: Not on file    Relationship status: Not on file  . Intimate partner violence    Fear of current or ex partner: Not on file    Emotionally abused: Not on file    Physically abused: Not on file    Forced sexual activity: Not on file  Other Topics Concern  . Not on file  Social History Narrative   Divorced and remarried 1990's   1 son from first marriage, 2 from second    Family History  Problem Relation Age of Onset  . Coronary artery disease Mother   . Heart attack Mother        early 52's  . Diabetes Mother   . Hypertension Mother   . Cancer Father        lung     Allergies  Allergen Reactions  . Atorvastatin     REACTION: myalgias  . Tribenzor [Olmesartan-Amlodipine-Hctz] Other (See Comments)    Joint pain    Medication list reviewed and updated in full in [place].  GEN: No fevers, chills. Nontoxic. Primarily MSK c/o today. MSK: Detailed in the HPI GI: tolerating PO intake without difficulty Neuro: No numbness, parasthesias, or tingling associated. Otherwise the pertinent positives of the ROS are noted above.   Objective:   BP 136/80   Pulse 67   Temp 98.6 F (37 C) (Temporal)   Ht 5' 5.4" (1.661 m)   Wt 178 lb 4 oz (80.9 kg)   SpO2 98%   BMI 29.30 kg/m    GEN: WDWN, NAD, Non-toxic, Alert & Oriented x 3 HEENT: Atraumatic, Normocephalic.  Ears and Nose: No external deformity. EXTR: No clubbing/cyanosis/edema NEURO: Normal gait.   PSYCH: Normally interactive. Conversant. Not depressed or anxious appearing.  Calm demeanor.    Left knee, the patient has full extension, but his flexion is only to 90 degrees.  He does have some moderate medial joint line tenderness.  No significant lateral joint line tenderness.  Stable to varus and valgus stress.  ACL and PCL are intact.  He does have some pain but is only moderate with McMurray's as well as flexion pinch.  Radiology: Dg Knee 4 Views W/patella Left  Result Date: 01/23/2019 CLINICAL DATA:  Left knee pain. Evaluate for osteoarthritis. Initial encounter. EXAM: LEFT KNEE - COMPLETE 4+ VIEW COMPARISON:  None. FINDINGS: Four view left knee: No fracture or dislocation. Mild to moderate medial tibiofemoral joint degenerative changes. Probable subchondral cystic changes below tibial spine region. Mild patellofemoral joint degenerative changes. Minimal lateral tibiofemoral joint degenerative changes. Very small suprapatellar joint effusion. Single view right knee: 1.2 cm nonspecific lesion proximal tibia may represent subchondral cyst related to degenerative changes. IMPRESSION: Four view left knee: 1. Mild to moderate medial tibiofemoral joint degenerative changes. 2. Probable subchondral cystic changes inferior to tibial spine region. 3. Mild patellofemoral joint degenerative changes. 4. Minimal lateral tibiofemoral joint degenerative changes. 5. Very small suprapatellar joint effusion. Single view right knee: 1.2 cm nonspecific lesion proximal tibia may represent subchondral cyst related to degenerative changes. Electronically Signed   By: Genia Del M.D.   On: 01/23/2019 10:45    Assessment and Plan:     ICD-10-CM   1. Osteoarthrosis, localized, primary, knee, left  M17.12   2. Left knee pain, unspecified chronicity  M25.562    He does have some very significant arthritis from a clinical standpoint as well as radiographically, but clinical loss of motion is more than expected.  At  this point he is not all that symptomatic, so I am going to have him do some continued range of motion work and follow-up PRN  Follow-up: No follow-ups on file.  No orders of the defined types were placed in this encounter.  No orders of the defined types were placed in this encounter.   Signed,  Maud Deed. Minard Millirons, MD   Outpatient Encounter Medications as of 10/02/2019  Medication Sig  . amLODipine (NORVASC) 10 MG tablet Take 1 tablet (10 mg total) by mouth daily.  Marland Kitchen aspirin 81 MG tablet Take 81 mg by mouth daily.    . carvedilol (COREG) 6.25 MG tablet TAKE 1 TABLET (6.25 MG TOTAL) BY MOUTH 2 (TWO) TIMES DAILY WITH A MEAL.  . fish oil-omega-3 fatty acids 1000 MG capsule Take 2 g by mouth daily.    . hydrochlorothiazide (HYDRODIURIL) 25 MG tablet TAKE 1 TABLET BY MOUTH EVERY DAY  . OVER THE COUNTER MEDICATION Omega Q plus Resveratrol and Turmeric  . ramipril (ALTACE) 10 MG capsule TAKE 1 CAPSULE BY MOUTH TWICE A DAY  . rosuvastatin (CRESTOR) 5 MG tablet PLEASE SEE ATTACHED FOR DETAILED DIRECTIONS   No facility-administered encounter medications on file as of 10/02/2019.

## 2019-10-02 ENCOUNTER — Ambulatory Visit: Payer: Managed Care, Other (non HMO) | Admitting: Family Medicine

## 2019-10-02 ENCOUNTER — Encounter: Payer: Self-pay | Admitting: Family Medicine

## 2019-10-02 ENCOUNTER — Other Ambulatory Visit: Payer: Self-pay

## 2019-10-02 VITALS — BP 136/80 | HR 67 | Temp 98.6°F | Ht 65.4 in | Wt 178.2 lb

## 2019-10-02 DIAGNOSIS — M25562 Pain in left knee: Secondary | ICD-10-CM | POA: Diagnosis not present

## 2019-10-02 DIAGNOSIS — M1712 Unilateral primary osteoarthritis, left knee: Secondary | ICD-10-CM | POA: Diagnosis not present

## 2019-10-15 ENCOUNTER — Other Ambulatory Visit: Payer: Self-pay | Admitting: Cardiovascular Disease

## 2019-10-16 ENCOUNTER — Other Ambulatory Visit: Payer: Self-pay | Admitting: Cardiovascular Disease

## 2019-10-16 DIAGNOSIS — I10 Essential (primary) hypertension: Secondary | ICD-10-CM

## 2019-10-16 DIAGNOSIS — I2510 Atherosclerotic heart disease of native coronary artery without angina pectoris: Secondary | ICD-10-CM

## 2019-10-16 DIAGNOSIS — E785 Hyperlipidemia, unspecified: Secondary | ICD-10-CM

## 2020-01-01 ENCOUNTER — Other Ambulatory Visit: Payer: Self-pay | Admitting: Cardiovascular Disease

## 2020-01-12 ENCOUNTER — Encounter: Payer: Managed Care, Other (non HMO) | Admitting: Internal Medicine

## 2020-03-13 ENCOUNTER — Other Ambulatory Visit: Payer: Self-pay

## 2020-03-13 ENCOUNTER — Encounter: Payer: Self-pay | Admitting: Internal Medicine

## 2020-03-13 ENCOUNTER — Ambulatory Visit (INDEPENDENT_AMBULATORY_CARE_PROVIDER_SITE_OTHER): Payer: Medicare HMO | Admitting: Internal Medicine

## 2020-03-13 VITALS — BP 140/82 | HR 68 | Temp 97.8°F | Ht 64.75 in | Wt 179.0 lb

## 2020-03-13 DIAGNOSIS — E785 Hyperlipidemia, unspecified: Secondary | ICD-10-CM | POA: Diagnosis not present

## 2020-03-13 DIAGNOSIS — I10 Essential (primary) hypertension: Secondary | ICD-10-CM | POA: Diagnosis not present

## 2020-03-13 DIAGNOSIS — Z125 Encounter for screening for malignant neoplasm of prostate: Secondary | ICD-10-CM

## 2020-03-13 DIAGNOSIS — Z7189 Other specified counseling: Secondary | ICD-10-CM | POA: Insufficient documentation

## 2020-03-13 DIAGNOSIS — M170 Bilateral primary osteoarthritis of knee: Secondary | ICD-10-CM

## 2020-03-13 DIAGNOSIS — I2510 Atherosclerotic heart disease of native coronary artery without angina pectoris: Secondary | ICD-10-CM | POA: Diagnosis not present

## 2020-03-13 DIAGNOSIS — Z0000 Encounter for general adult medical examination without abnormal findings: Secondary | ICD-10-CM | POA: Diagnosis not present

## 2020-03-13 LAB — COMPREHENSIVE METABOLIC PANEL
ALT: 79 U/L — ABNORMAL HIGH (ref 0–53)
AST: 29 U/L (ref 0–37)
Albumin: 4.6 g/dL (ref 3.5–5.2)
Alkaline Phosphatase: 56 U/L (ref 39–117)
BUN: 24 mg/dL — ABNORMAL HIGH (ref 6–23)
CO2: 29 mEq/L (ref 19–32)
Calcium: 10 mg/dL (ref 8.4–10.5)
Chloride: 102 mEq/L (ref 96–112)
Creatinine, Ser: 0.94 mg/dL (ref 0.40–1.50)
GFR: 80.51 mL/min (ref >60.00)
Glucose, Bld: 123 mg/dL — ABNORMAL HIGH (ref 70–99)
Potassium: 3.8 mEq/L (ref 3.5–5.1)
Sodium: 139 mEq/L (ref 135–145)
Total Bilirubin: 0.8 mg/dL (ref 0.2–1.2)
Total Protein: 7.1 g/dL (ref 6.0–8.3)

## 2020-03-13 LAB — LIPID PANEL
Cholesterol: 160 mg/dL (ref 0–200)
HDL: 34.2 mg/dL — ABNORMAL LOW (ref >39.00)
LDL Cholesterol: 87 mg/dL (ref 0–99)
NonHDL: 125.79
Total CHOL/HDL Ratio: 5
Triglycerides: 195 mg/dL — ABNORMAL HIGH (ref 0.0–149.0)
VLDL: 39 mg/dL (ref 0.0–40.0)

## 2020-03-13 LAB — CBC
HCT: 43.7 % (ref 39.0–52.0)
Hemoglobin: 15.5 g/dL (ref 13.0–17.0)
MCHC: 35.5 g/dL (ref 30.0–36.0)
MCV: 85.6 fl (ref 78.0–100.0)
Platelets: 179 10*3/uL (ref 150.0–400.0)
RBC: 5.11 Mil/uL (ref 4.22–5.81)
RDW: 14 % (ref 11.5–15.5)
WBC: 7 10*3/uL (ref 4.0–10.5)

## 2020-03-13 LAB — PSA, MEDICARE: PSA: 0.41 ng/ml (ref 0.10–4.00)

## 2020-03-13 NOTE — Assessment & Plan Note (Signed)
See social history 

## 2020-03-13 NOTE — Progress Notes (Signed)
Subjective:    Patient ID: [name], male    DOB: 07/20/1956, 64 y.o.   MRN: [id number]  HPI Here for Welcome to Medicare visit and follow up of chronic health conditions This visit occurred during the SARS-CoV-2 public health emergency.  Safety protocols were in place, including screening questions prior to the visit, additional usage of staff PPE, and extensive cleaning of exam room while observing appropriate contact time as indicated for disinfecting solutions.   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 amLODipine (NORVASC) 10 MG tablet Take 1 tablet (10 mg total) by mouth daily. 90 tablet 3  . aspirin 81 MG tablet Take 81 mg by mouth daily.      . carvedilol (COREG) 6.25 MG tablet TAKE 1 TABLET BY MOUTH 2 TIMES DAILY WITH A MEAL. 180 tablet 3  . fish oil-omega-3 fatty acids 1000 MG capsule Take 2 g by mouth daily.      . hydrochlorothiazide (HYDRODIURIL) 25 MG tablet TAKE 1 TABLET BY MOUTH EVERY DAY 90 tablet 3  . NON FORMULARY     . OVER THE COUNTER MEDICATION Omega Q plus Resveratrol and Turmeric    . ramipril (ALTACE) 10 MG capsule TAKE 1 CAPSULE BY MOUTH TWICE A DAY 180 capsule 3  . rosuvastatin (CRESTOR) 5 MG tablet TAKE 1 TABLET BY MOUTH ONCE A DAY 90 tablet 2   No current facility-administered medications on file prior to visit.    Allergies  Allergen Reactions  . Atorvastatin     REACTION: myalgias  . Tribenzor  [Olmesartan-Amlodipine-Hctz] Other (See Comments)    Joint pain    Past Medical History:  Diagnosis Date  . CAD (coronary artery disease)    post stent X 3 to LAD  . Coronary atherosclerosis of native coronary artery 10/08/2007   Qualifier: Diagnosis of  By: Danelle Earthly CMA, Darlene    . Dyslipidemia   . Essential hypertension 10/07/2007   Qualifier: Diagnosis of  By: Wynona Luna   . HEMOCCULT POSITIVE STOOL 06/17/2010   Qualifier: Diagnosis of  By: Wynona Luna   . Hyperglycemia 03/20/2010   Qualifier: Diagnosis of  By: Wynona Luna   . Hyperlipidemia 10/08/2007   Qualifier: Diagnosis of  By: Danelle Earthly CMA, Darlene    . Hypertension   . Osteoarthritis of both knees 07/16/2008   Qualifier: Diagnosis of  By: Wynona Luna   . Unspecified disorder of prostate 07/29/2010   Qualifier: Diagnosis of  By: Wynona Luna     Past Surgical History:  Procedure Laterality Date  . CORONARY STENT PLACEMENT  2002  . EYE MUSCLE SURGERY      Family History  Problem Relation Age of Onset  . Coronary artery disease Mother   . Heart attack Mother        early 6's  . Diabetes Mother   . Hypertension Mother   . Cancer Father        lung  Social History   Socioeconomic History  . Marital status: Married    Spouse name: Not on file  . Number of children: 3  . Years of education: Not on file  . Highest education level: Not on file  Occupational History  . Occupation: Dealer for Administrator, sports: [employer]    Comment: Retired  Tobacco Use  . Smoking status: Former Smoker    Start date: 01/29/1974    Quit date: 12/28/1978    Years since quitting: 41.2  . Smokeless tobacco: Former Systems developer    Quit date: 01/30/1984  Substance and Sexual Activity  . Alcohol use: No  . Drug use: No  . Sexual activity: Not on file  Other Topics Concern  . Not on file  Social History Narrative   Divorced and remarried 1990's   1 son from first marriage, 2 from  second      No living will   Would want wife to be health care POA--- children are alternate   Would accept resuscitation   Not sure about tube feeds   Social Determinants of Health   Financial Resource Strain:   . Difficulty of Paying Living Expenses:   Food Insecurity:   . Worried About Charity fundraiser in the Last Year:   . Arboriculturist in the Last Year:   Transportation Needs:   . Film/video editor (Medical):   Marland Kitchen Lack of Transportation (Non-Medical):   Physical Activity:   . Days of Exercise per Week:   . Minutes of Exercise per Session:   Stress:   . Feeling of Stress :   Social Connections:   . Frequency of Communication with Friends and Family:   . Frequency of Social Gatherings with Friends and Family:   . Attends Religious Services:   . Active Member of Clubs or Organizations:   . Attends Archivist Meetings:   Marland Kitchen Marital Status:   Intimate Partner Violence:   . Fear of Current or Ex-Partner:   . Emotionally Abused:   Marland Kitchen Physically Abused:   . Sexually Abused:    Review of Systems No headaches Appetite is fine Weight is stable Sleeps fine Wears seat belt Teeth are okay--overdue with dentist (needs to establish) No rash or suspicious skin lesions No heartburn or dysphagia Bowels move fine---no blood Voids pretty good. Stream a bit slow but not a problem. Nocturia x 1-2 No back pain    Objective:   Physical Exam  Constitutional: He is oriented to person, place, and time. He appears well-developed. No distress.  HENT:  Head: Normocephalic and atraumatic.  Right Ear: External ear normal.  Left Ear: External ear normal.  Mouth/Throat: Oropharynx is clear and moist. No oropharyngeal exudate.  Eyes: Pupils are equal, round, and reactive to light. Conjunctivae are normal.  Neck: No thyromegaly present.  Cardiovascular: Normal rate, regular rhythm, normal heart sounds and intact distal pulses. Exam reveals no gallop.  No murmur  heard. Respiratory: Effort normal and breath sounds normal. No respiratory distress. He has no wheezes. He has no rales.  GI: Soft. There is no abdominal tenderness.  Musculoskeletal:        General: No tenderness or edema.  Lymphadenopathy:    He has no cervical adenopathy.  Neurological: He is alert and oriented to person, place, and time.  President---"Biden, Trump, Obama" [id number] Recall 3/3  Skin: No rash noted. No erythema.  Psychiatric: He has a normal mood and  affect. His behavior is normal.           Assessment & Plan:

## 2020-03-13 NOTE — Assessment & Plan Note (Signed)
I have personally reviewed the Medicare Annual Wellness questionnaire and have noted 1. The patient's medical and social history 2. Their use of alcohol, tobacco or illicit drugs 3. Their current medications and supplements 4. The patient's functional ability including ADL's, fall risks, home safety risks and hearing or visual             impairment. 5. Diet and physical activities 6. Evidence for depression or mood disorders  The patients weight, height, BMI and visual acuity have been recorded in the chart I have made referrals, counseling and provided education to the patient based review of the above and I have provided the pt with a written personalized care plan for preventive services.  I have provided you with a copy of your personalized plan for preventive services. Please take the time to review along with your updated medication list.  Due for colonoscopy in July Discussed PSA---will check Flu vaccine in fall Urged him to get COVID vaccine Discussed fitness

## 2020-03-13 NOTE — Assessment & Plan Note (Signed)
On statin for secondary prevention

## 2020-03-13 NOTE — Progress Notes (Signed)
Hearing Screening   125Hz  250Hz  500Hz  1000Hz  2000Hz  3000Hz  4000Hz  6000Hz  8000Hz   Right ear:   20 20 20  20     Left ear:   20 20 20  20       Visual Acuity Screening   Right eye Left eye Both eyes  Without correction: 20 30 20  50 20 30  With correction:

## 2020-03-13 NOTE — Assessment & Plan Note (Signed)
Improved Discussed quad strengthening

## 2020-03-13 NOTE — Assessment & Plan Note (Signed)
BP Readings from Last 3 Encounters:  03/13/20 140/82  10/02/19 136/80  08/14/19 (!) 170/100   Reasonable control on his heart meds

## 2020-03-13 NOTE — Assessment & Plan Note (Signed)
No symptoms On statin, ACEI, asa, beta blocker Sees cardiologist once a year

## 2020-04-01 ENCOUNTER — Other Ambulatory Visit: Payer: Self-pay | Admitting: Cardiovascular Disease

## 2020-06-29 ENCOUNTER — Other Ambulatory Visit: Payer: Self-pay | Admitting: Cardiovascular Disease

## 2020-08-25 ENCOUNTER — Other Ambulatory Visit: Payer: Self-pay | Admitting: Cardiovascular Disease

## 2020-10-11 ENCOUNTER — Other Ambulatory Visit: Payer: Self-pay | Admitting: Cardiovascular Disease

## 2020-10-25 ENCOUNTER — Other Ambulatory Visit: Payer: Self-pay | Admitting: Cardiovascular Disease

## 2020-10-25 DIAGNOSIS — E785 Hyperlipidemia, unspecified: Secondary | ICD-10-CM

## 2020-10-25 DIAGNOSIS — I2510 Atherosclerotic heart disease of native coronary artery without angina pectoris: Secondary | ICD-10-CM

## 2020-10-25 DIAGNOSIS — I10 Essential (primary) hypertension: Secondary | ICD-10-CM

## 2020-11-03 IMAGING — DX DG KNEE COMPLETE 4+V*L*
4 series · 4 of 4 positions shown · non-contrast
Comparison: None.

CLINICAL DATA: Left knee pain. Evaluate for osteoarthritis. Initial
encounter.

EXAM:
LEFT KNEE - COMPLETE 4+ VIEW

[knee ap]
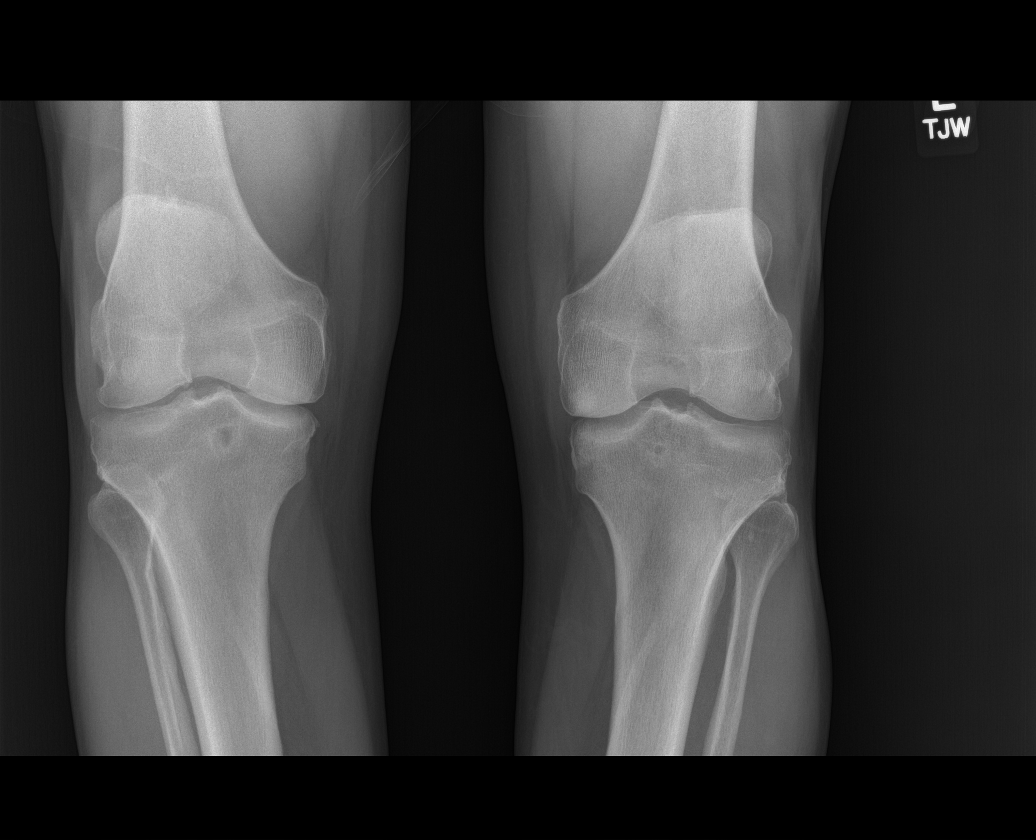

[knee lat]
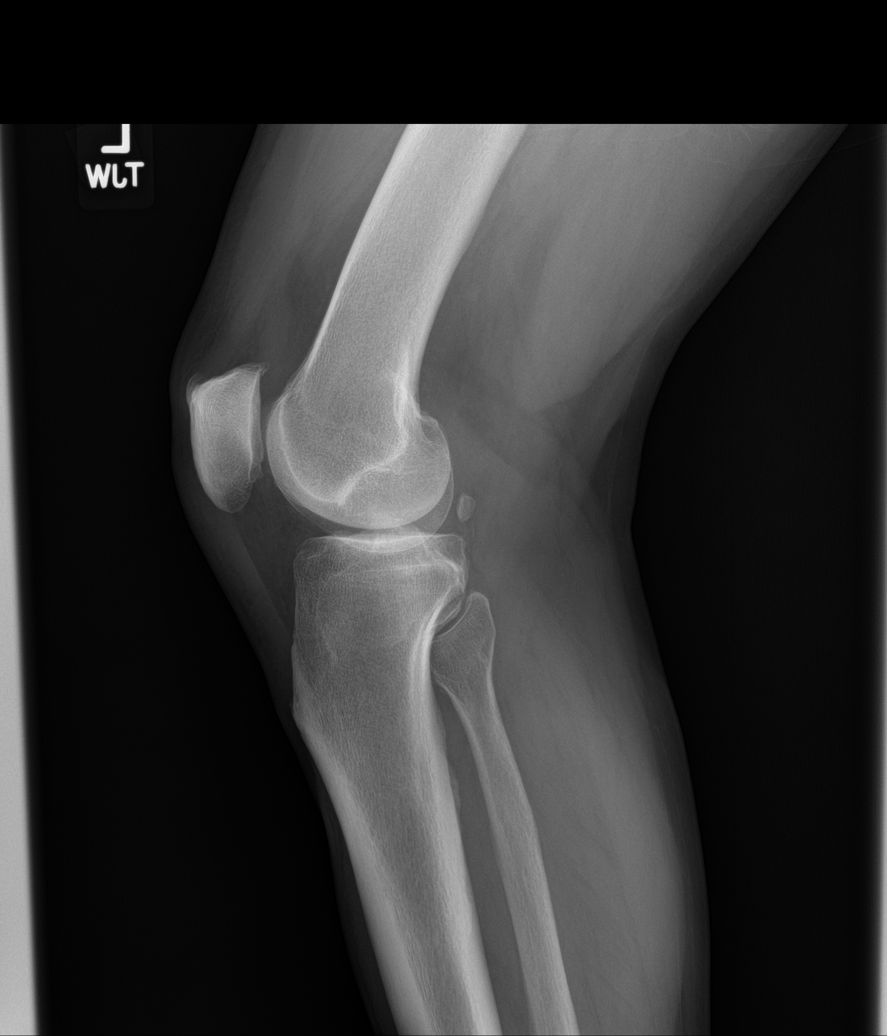

[patella skyline]
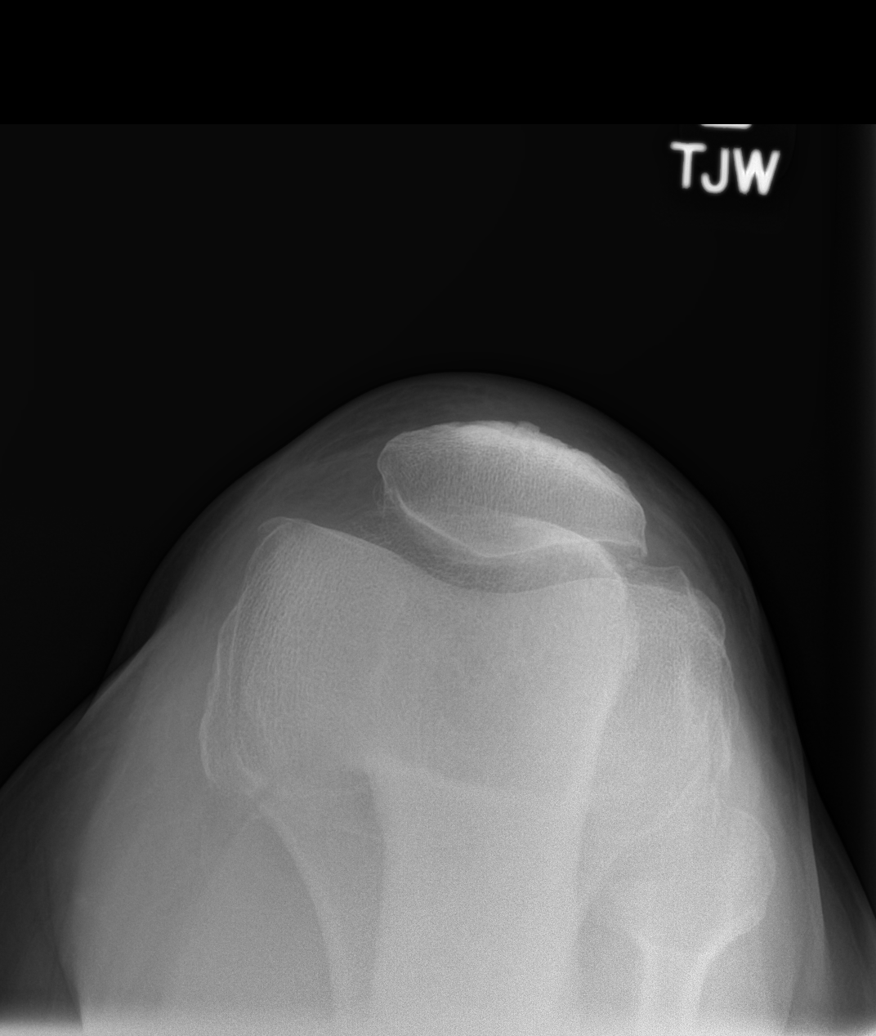

[knee obl]
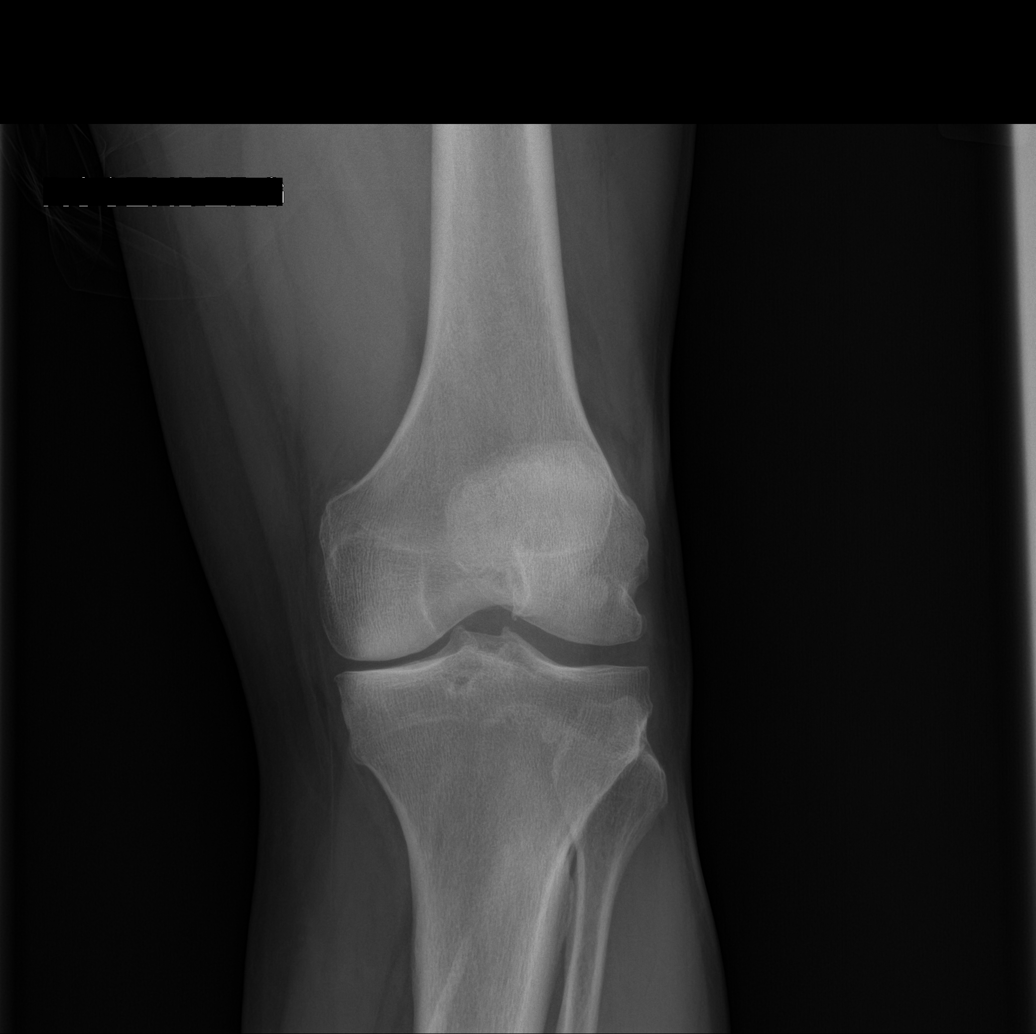

[4 of 4 positions shown; findings below may reference images not displayed]

FINDINGS: Four view left knee:

No fracture or dislocation. Mild to moderate medial tibiofemoral
joint degenerative changes. Probable subchondral cystic changes
below tibial spine region. Mild patellofemoral joint degenerative
changes. Minimal lateral tibiofemoral joint degenerative changes.
Very small suprapatellar joint effusion.

Single view right knee:

1.2 cm nonspecific lesion proximal tibia may represent subchondral
cyst related to degenerative changes.
IMPRESSION: Four view left knee:

1. Mild to moderate medial tibiofemoral joint degenerative changes.
2. Probable subchondral cystic changes inferior to tibial spine
region.
3. Mild patellofemoral joint degenerative changes.
4. Minimal lateral tibiofemoral joint degenerative changes.
5. Very small suprapatellar joint effusion.

Single view right knee:

1.2 cm nonspecific lesion proximal tibia may represent subchondral
cyst related to degenerative changes.

## 2020-11-04 ENCOUNTER — Ambulatory Visit: Payer: Medicare HMO | Admitting: Cardiovascular Disease

## 2020-11-04 ENCOUNTER — Encounter: Payer: Self-pay | Admitting: Cardiovascular Disease

## 2020-11-04 ENCOUNTER — Other Ambulatory Visit: Payer: Self-pay

## 2020-11-04 VITALS — BP 150/90 | HR 65 | Ht 64.0 in | Wt 178.6 lb

## 2020-11-04 DIAGNOSIS — E782 Mixed hyperlipidemia: Secondary | ICD-10-CM

## 2020-11-04 DIAGNOSIS — I2510 Atherosclerotic heart disease of native coronary artery without angina pectoris: Secondary | ICD-10-CM | POA: Diagnosis not present

## 2020-11-04 DIAGNOSIS — I10 Essential (primary) hypertension: Secondary | ICD-10-CM

## 2020-11-04 NOTE — Patient Instructions (Signed)

## 2020-11-04 NOTE — Progress Notes (Signed)
Cardiology Office Note:    Date:  11/04/2020   ID:  [name], DOB 12/18/1956, MRN [id number]  PCP:  Venia Carbon, MD  [facility] Cardiologist:  Sherren Mocha, MD  [place] Electrophysiologist:  None   Referring MD: Venia Carbon, MD   Chief Complaint  Patient presents with  . Coronary Artery Disease    History of Present Illness:    [name] is a 64 y.o. male with a hx of coronary artery disease, presenting for follow-up evaluation.  The patient initially presented in 2002 when he was treated with bare-metal stenting of the LAD, treated with 3 stents.  Comorbidities include hypertension and mixed hyperlipidemia.  The patient has a long history of whitecoat hypertension.  The patient has undergone 24-hour ambulatory blood pressure monitoring demonstrating good blood pressure control.  He has a history of statin intolerance.  The patient is here alone today.  He has been doing pretty well.  He retired about 6 months ago.  The patient is reasonably active but not engaged in too much routine exercise.  He does some walking periodically.  He plays golf on a regular basis.  He denies any chest pain, chest pressure, or shortness of breath.  He denies edema, orthopnea, PND, or heart palpitations.  States that his blood pressure has been controlled at home.  He is only taking Crestor once per week because of intolerance to this medicine.  He has a lot of pain in his hands when he takes it more than that.  Past Medical History:  Diagnosis Date  . CAD (coronary artery disease)    post stent X 3 to LAD  . Coronary atherosclerosis of native coronary artery 10/08/2007   Qualifier: Diagnosis of  By: Danelle Earthly CMA, Darlene    . Dyslipidemia   . Essential hypertension 10/07/2007   Qualifier: Diagnosis of  By: Wynona Luna   . HEMOCCULT POSITIVE STOOL 06/17/2010   Qualifier: Diagnosis of  By: Wynona Luna   . Hyperglycemia 03/20/2010   Qualifier: Diagnosis of  By:  Wynona Luna   . Hyperlipidemia 10/08/2007   Qualifier: Diagnosis of  By: Danelle Earthly CMA, Darlene    . Hypertension   . Osteoarthritis of both knees 07/16/2008   Qualifier: Diagnosis of  By: Wynona Luna   . Unspecified disorder of prostate 07/29/2010   Qualifier: Diagnosis of  By: Wynona Luna     Past Surgical History:  Procedure Laterality Date  . CORONARY STENT PLACEMENT  2002  . EYE MUSCLE SURGERY      Current Medications: Current Meds  Medication Sig  . amLODipine (NORVASC) 10 MG tablet TAKE 1 TABLET BY MOUTH EVERY DAY  . aspirin 81 MG tablet Take 81 mg by mouth daily.    . carvedilol (COREG) 6.25 MG tablet TAKE 1 TABLET BY MOUTH 2 TIMES DAILY WITH A MEAL.  Marland Kitchen Cholecalciferol (VITAMIN D) 50 MCG (2000 UT) tablet Take 2,000 Units by mouth daily.  . fish oil-omega-3 fatty acids 1000 MG capsule Take 2 g by mouth daily.    . hydrochlorothiazide (HYDRODIURIL) 25 MG tablet Take 1 tablet (25 mg total) by mouth daily. Please keep upcoming appt in November with Dr. Burt Knack before anymore refills. Thank you  . Multiple Vitamin (MULTIVITAMIN) tablet Take 1 tablet by mouth daily.  . NON FORMULARY   . OVER THE COUNTER MEDICATION Omega Q plus Resveratrol and Turmeric  . ramipril (ALTACE) 10 MG  capsule Take 1 capsule (10 mg total) by mouth 2 (two) times daily. Please keep upcoming appt in November with Dr. Burt Knack before anymore refills. Thank you  . rosuvastatin (CRESTOR) 5 MG tablet TAKE 1 TABLET BY MOUTH ONCE A DAY     Allergies:   Atorvastatin and Tribenzor [olmesartan-amlodipine-hctz]   Social History   Socioeconomic History  . Marital status: Married    Spouse name: Not on file  . Number of children: 3  . Years of education: Not on file  . Highest education level: Not on file  Occupational History  . Occupation: Dealer for Administrator, sports: [employer]    Comment: Retired  Tobacco Use  . Smoking status: Former Smoker    Start date: 01/29/1974     Quit date: 12/28/1978    Years since quitting: 41.8  . Smokeless tobacco: Former Systems developer    Quit date: 01/30/1984  Substance and Sexual Activity  . Alcohol use: No  . Drug use: No  . Sexual activity: Not on file  Other Topics Concern  . Not on file  Social History Narrative   Divorced and remarried 1990's   1 son from first marriage, 2 from second      No living will   Would want wife to be health care POA--- children are alternate   Would accept resuscitation   Not sure about tube feeds   Social Determinants of Health   Financial Resource Strain:   . Difficulty of Paying Living Expenses: Not on file  Food Insecurity:   . Worried About Charity fundraiser in the Last Year: Not on file  . Ran Out of Food in the Last Year: Not on file  Transportation Needs:   . Lack of Transportation (Medical): Not on file  . Lack of Transportation (Non-Medical): Not on file  Physical Activity:   . Days of Exercise per Week: Not on file  . Minutes of Exercise per Session: Not on file  Stress:   . Feeling of Stress : Not on file  Social Connections:   . Frequency of Communication with Friends and Family: Not on file  . Frequency of Social Gatherings with Friends and Family: Not on file  . Attends Religious Services: Not on file  . Active Member of Clubs or Organizations: Not on file  . Attends Archivist Meetings: Not on file  . Marital Status: Not on file     Family History: The patient's family history includes Cancer in his father; Coronary artery disease in his mother; Diabetes in his mother; Heart attack in his mother; Hypertension in his mother.  ROS:   Please see the history of present illness.    All other systems reviewed and are negative.  EKGs/Labs/Other Studies Reviewed:    EKG:  EKG is ordered today.  The ekg ordered today demonstrates normal sinus rhythm 65 bpm, within normal limits.  Recent Labs: 03/13/2020: ALT 79; BUN 24; Creatinine, Ser 0.94; Hemoglobin  15.5; Platelets 179.0; Potassium 3.8; Sodium 139  Recent Lipid Panel    Component Value Date/Time   CHOL 160 03/13/2020 1007   CHOL 170 02/18/2017 0734   TRIG 195.0 (H) 03/13/2020 1007   TRIG 79 01/03/2007 0737   HDL 34.20 (L) 03/13/2020 1007   HDL 33 (L) 02/18/2017 0734   CHOLHDL 5 03/13/2020 1007   VLDL 39.0 03/13/2020 1007   LDLCALC 87 03/13/2020 1007   LDLCALC 100 (H) 02/18/2017 0734   LDLDIRECT  96.0 01/06/2019 1035     Risk Assessment/Calculations:       Physical Exam:    VS:  BP (!) 150/90   Pulse 65   Ht 5\' 4"  (1.626 m)   Wt 178 lb 9.6 oz (81 kg)   SpO2 97%   BMI 30.66 kg/m     Wt Readings from Last 3 Encounters:  11/04/20 178 lb 9.6 oz (81 kg)  03/13/20 179 lb (81.2 kg)  10/02/19 178 lb 4 oz (80.9 kg)     GEN:  Well nourished, well developed in no acute distress HEENT: Normal NECK: No JVD; No carotid bruits LYMPHATICS: No lymphadenopathy CARDIAC: RRR, no murmurs, rubs, gallops RESPIRATORY:  Clear to auscultation without rales, wheezing or rhonchi  ABDOMEN: Soft, non-tender, non-distended MUSCULOSKELETAL:  No edema; No deformity  SKIN: Warm and dry NEUROLOGIC:  Alert and oriented x 3 PSYCHIATRIC:  Normal affect   ASSESSMENT:    1. Coronary artery disease involving native coronary artery of native heart without angina pectoris   2. Essential hypertension   3. Mixed hyperlipidemia    PLAN:    In order of problems listed above:  1. The patient is stable with no symptoms of angina.  He is treated with aspirin, beta-blocker, calcium channel blocker, and low-dose of a statin drug based on medication intolerance. 2. Whitecoat hypertension has been longstanding.  Home blood pressure readings have been in range.  He has undergone previous ambulatory monitoring.  He continues on amlodipine, carvedilol, hydrochlorothiazide, and ramipril.  Most recent labs reviewed with a creatinine of 0.94 and potassium of 3.8. 3. Lengthy discussion with the patient about  treatment options.  We discussed diet and lifestyle modification.  His LDL cholesterol is above goal at 87 mg/dL.  However, this is improved from his past readings.  I discussed to pharmacologic options with him today.  This includes ezetimibe 10 mg daily or referral to lipid clinic for a PCSK9 inhibitor.  The patient is not interested in taking more medication at this time.  He will call if he changes his mind.  I explained to him the rationale for secondary prevention and that his goal LDL cholesterol is less than 70 mg/dL.  He will think about this and let us know if he decides to pursue either of these medicine options.    Shared Decision Making/Informed Consent      Medication Adjustments/Labs and Tests Ordered: Current medicines are reviewed at length with the patient today.  Concerns regarding medicines are outlined above.  No orders of the defined types were placed in this encounter.  No orders of the defined types were placed in this encounter.   There are no Patient Instructions on file for this visit.   Signed, Sherren Mocha, MD  11/04/2020 8:43 AM    [place]

## 2020-11-24 ENCOUNTER — Other Ambulatory Visit: Payer: Self-pay | Admitting: Cardiovascular Disease

## 2020-12-16 ENCOUNTER — Other Ambulatory Visit: Payer: Self-pay | Admitting: Cardiovascular Disease

## 2020-12-16 DIAGNOSIS — I2510 Atherosclerotic heart disease of native coronary artery without angina pectoris: Secondary | ICD-10-CM

## 2020-12-16 DIAGNOSIS — I10 Essential (primary) hypertension: Secondary | ICD-10-CM

## 2020-12-16 DIAGNOSIS — E785 Hyperlipidemia, unspecified: Secondary | ICD-10-CM

## 2020-12-27 ENCOUNTER — Other Ambulatory Visit: Payer: Self-pay | Admitting: Cardiovascular Disease

## 2021-01-17 ENCOUNTER — Other Ambulatory Visit: Payer: Self-pay | Admitting: Cardiovascular Disease

## 2021-01-17 DIAGNOSIS — I2510 Atherosclerotic heart disease of native coronary artery without angina pectoris: Secondary | ICD-10-CM

## 2021-01-17 DIAGNOSIS — I10 Essential (primary) hypertension: Secondary | ICD-10-CM

## 2021-01-17 DIAGNOSIS — E785 Hyperlipidemia, unspecified: Secondary | ICD-10-CM

## 2021-03-14 ENCOUNTER — Ambulatory Visit: Payer: Medicare HMO

## 2021-03-21 ENCOUNTER — Other Ambulatory Visit: Payer: Self-pay

## 2021-03-21 ENCOUNTER — Encounter: Payer: Self-pay | Admitting: Internal Medicine

## 2021-03-21 ENCOUNTER — Ambulatory Visit (INDEPENDENT_AMBULATORY_CARE_PROVIDER_SITE_OTHER): Payer: Medicare HMO | Admitting: Internal Medicine

## 2021-03-21 VITALS — BP 140/90 | HR 65 | Temp 98.2°F | Ht 64.5 in | Wt 181.0 lb

## 2021-03-21 DIAGNOSIS — I2510 Atherosclerotic heart disease of native coronary artery without angina pectoris: Secondary | ICD-10-CM

## 2021-03-21 DIAGNOSIS — R739 Hyperglycemia, unspecified: Secondary | ICD-10-CM | POA: Diagnosis not present

## 2021-03-21 DIAGNOSIS — Z0000 Encounter for general adult medical examination without abnormal findings: Secondary | ICD-10-CM

## 2021-03-21 DIAGNOSIS — E785 Hyperlipidemia, unspecified: Secondary | ICD-10-CM | POA: Diagnosis not present

## 2021-03-21 DIAGNOSIS — M8949 Other hypertrophic osteoarthropathy, multiple sites: Secondary | ICD-10-CM | POA: Diagnosis not present

## 2021-03-21 DIAGNOSIS — Z7189 Other specified counseling: Secondary | ICD-10-CM | POA: Diagnosis not present

## 2021-03-21 DIAGNOSIS — I10 Essential (primary) hypertension: Secondary | ICD-10-CM

## 2021-03-21 DIAGNOSIS — M159 Polyosteoarthritis, unspecified: Secondary | ICD-10-CM

## 2021-03-21 LAB — LIPID PANEL
Cholesterol: 169 mg/dL (ref 0–200)
HDL: 38.3 mg/dL — ABNORMAL LOW (ref >39.00)
LDL Cholesterol: 98 mg/dL (ref 0–99)
NonHDL: 130.2
Total CHOL/HDL Ratio: 4
Triglycerides: 161 mg/dL — ABNORMAL HIGH (ref 0.0–149.0)
VLDL: 32.2 mg/dL (ref 0.0–40.0)

## 2021-03-21 LAB — COMPREHENSIVE METABOLIC PANEL
ALT: 80 U/L — ABNORMAL HIGH (ref 0–53)
AST: 32 U/L (ref 0–37)
Albumin: 5.1 g/dL (ref 3.5–5.2)
Alkaline Phosphatase: 73 U/L (ref 39–117)
BUN: 18 mg/dL (ref 6–23)
CO2: 25 mEq/L (ref 19–32)
Calcium: 9.6 mg/dL (ref 8.4–10.5)
Chloride: 103 mEq/L (ref 96–112)
Creatinine, Ser: 0.88 mg/dL (ref 0.40–1.50)
GFR: 89.82 mL/min (ref >60.00)
Glucose, Bld: 128 mg/dL — ABNORMAL HIGH (ref 70–99)
Potassium: 3.8 mEq/L (ref 3.5–5.1)
Sodium: 139 mEq/L (ref 135–145)
Total Bilirubin: 0.8 mg/dL (ref 0.2–1.2)
Total Protein: 7.4 g/dL (ref 6.0–8.3)

## 2021-03-21 LAB — CBC
HCT: 45.8 % (ref 39.0–52.0)
Hemoglobin: 16.3 g/dL (ref 13.0–17.0)
MCHC: 35.7 g/dL (ref 30.0–36.0)
MCV: 85.5 fl (ref 78.0–100.0)
Platelets: 189 10*3/uL (ref 150.0–400.0)
RBC: 5.36 Mil/uL (ref 4.22–5.81)
RDW: 14.2 % (ref 11.5–15.5)
WBC: 7.2 10*3/uL (ref 4.0–10.5)

## 2021-03-21 LAB — HEMOGLOBIN A1C: Hgb A1c MFr Bld: 5.5 % (ref 4.6–6.5)

## 2021-03-21 NOTE — Progress Notes (Signed)
Hearing Screening   Method: Audiometry   125Hz  250Hz  500Hz  1000Hz  2000Hz  3000Hz  4000Hz  6000Hz  8000Hz   Right ear:   20 20 20  20     Left ear:   20 25 20   40      Visual Acuity Screening   Right eye Left eye Both eyes  Without correction: 20/30 20/30 20/30   With correction:

## 2021-03-21 NOTE — Assessment & Plan Note (Signed)
No symptoms On ASA, carvedilol, stain, ramipril

## 2021-03-21 NOTE — Assessment & Plan Note (Signed)
No problems with rosuvastatin for secondary prevention

## 2021-03-21 NOTE — Progress Notes (Signed)
Subjective:    Patient ID: [name], male    DOB: Apr 29, 1956, 65 y.o.   MRN: [id number]  HPI Here for Medicare wellness visit and follow up of chronic health conditions This visit occurred during the SARS-CoV-2 public health emergency.  Safety protocols were in place, including screening questions prior to the visit, additional usage of staff PPE, and extensive cleaning of exam room while observing appropriate contact time as indicated for disinfecting solutions.   Reviewed form and advanced directives Reviewed other doctors No alcohol or tobacco Tries to go to gym regularly ----thinks it is helping his memory Vision is slightly weaker---uses bifocals Hearing is fair No falls No depression or anhedonia---just adjusting to retirement (some mood issues with this) Independent with instrumental ADLs No sig memory issues  Having trouble getting used to retirement Goes to gym---working a couple of days a week (his past work)  Radio producer really good No chest pain or SOB Stamina is improving No dizziness or syncope No palpitations No edema  Current Outpatient Medications on File Prior to Visit  Medication Sig Dispense Refill  . amLODipine (NORVASC) 10 MG tablet TAKE 1 TABLET BY MOUTH EVERY DAY 90 tablet 3  . aspirin 81 MG tablet Take 81 mg by mouth daily.    . carvedilol (COREG) 6.25 MG tablet TAKE 1 TABLET BY MOUTH 2 TIMES DAILY WITH A MEAL. 180 tablet 3  . Cholecalciferol (VITAMIN D) 50 MCG (2000 UT) tablet Take 2,000 Units by mouth daily.    . fish oil-omega-3 fatty acids 1000 MG capsule Take 2 g by mouth daily.    . hydrochlorothiazide (HYDRODIURIL) 25 MG tablet Take 1 tablet (25 mg total) by mouth daily. 90 tablet 3  . Multiple Vitamin (MULTIVITAMIN) tablet Take 1 tablet by mouth daily.    . NON FORMULARY CBD Oil    . OVER THE COUNTER MEDICATION Omega Q plus Resveratrol and Turmeric    . ramipril (ALTACE) 10 MG capsule TAKE 1 CAPSULE BY MOUTH TWICE A DAY PLEASE KEEP APPT FOR  FUTURE REFILLS 180 capsule 3  . rosuvastatin (CRESTOR) 5 MG tablet TAKE 1 TABLET BY MOUTH ONCE A DAY 90 tablet 2   No current facility-administered medications on file prior to visit.    Allergies  Allergen Reactions  . Atorvastatin     REACTION: myalgias  . Tribenzor [Olmesartan-Amlodipine-Hctz] Other (See Comments)    Joint pain    Past Medical History:  Diagnosis Date  . CAD (coronary artery disease)    post stent X 3 to LAD  . Coronary atherosclerosis of native coronary artery 10/08/2007   Qualifier: Diagnosis of  By: Danelle Earthly CMA, Darlene    . Dyslipidemia   . Essential hypertension 10/07/2007   Qualifier: Diagnosis of  By: Wynona Luna   . HEMOCCULT POSITIVE STOOL 06/17/2010   Qualifier: Diagnosis of  By: Wynona Luna   . Hyperglycemia 03/20/2010   Qualifier: Diagnosis of  By: Wynona Luna   . Hyperlipidemia 10/08/2007   Qualifier: Diagnosis of  By: Danelle Earthly CMA, Darlene    . Hypertension   . Osteoarthritis of both knees 07/16/2008   Qualifier: Diagnosis of  By: Wynona Luna   . Unspecified disorder of prostate 07/29/2010   Qualifier: Diagnosis of  By: Wynona Luna     Past Surgical History:  Procedure Laterality Date  . CORONARY STENT PLACEMENT  2002  . EYE MUSCLE SURGERY      Family History  Problem Relation Age of Onset  . Coronary artery disease Mother   . Heart attack Mother        early 74's  . Diabetes Mother   . Hypertension Mother   . Cancer Father        lung     Social History   Socioeconomic History  . Marital status: Married    Spouse name: Not on file  . Number of children: 3  . Years of education: Not on file  . Highest education level: Not on file  Occupational History  . Occupation: Dealer for Administrator, sports: [employer]    Comment: Retired  Tobacco Use  . Smoking status: Former Smoker    Start date: 01/29/1974    Quit date: 12/28/1978    Years since quitting: 42.2  . Smokeless  tobacco: Former Systems developer    Quit date: 01/30/1984  Substance and Sexual Activity  . Alcohol use: No  . Drug use: No  . Sexual activity: Not on file  Other Topics Concern  . Not on file  Social History Narrative   Divorced and remarried 1990's   1 son from first marriage, 2 from second      No living will   Would want wife to be health care POA--- children are alternate   Would accept resuscitation   Not sure about tube feeds   Social Determinants of Health   Financial Resource Strain: Not on file  Food Insecurity: Not on file  Transportation Needs: Not on file  Physical Activity: Not on file  Stress: Not on file  Social Connections: Not on file  Intimate Partner Violence: Not on file   Review of Systems No headaches Appetite is good Weight is stable Sleeps okay. Nocturia x 1-2.  Voids okay in day---slow but okay Wears seat belt Teeth okay---hasn't been to the dentist----discussed No suspicious skin lesions No heartburn---no dysphagia Bowels move fine No sig back pains---some mild knee pain and in hands. CBD oil seems to help that (oral)    Objective:   Physical Exam Constitutional:      Appearance: Normal appearance.  HENT:     Mouth/Throat:     Comments: No lesions Eyes:     Conjunctiva/sclera: Conjunctivae normal.     Pupils: Pupils are equal, round, and reactive to light.  Cardiovascular:     Rate and Rhythm: Normal rate and regular rhythm.     Pulses: Normal pulses.     Heart sounds: No murmur heard. No gallop.   Pulmonary:     Effort: Pulmonary effort is normal.     Breath sounds: Normal breath sounds. No wheezing or rales.  Abdominal:     Palpations: Abdomen is soft.     Tenderness: There is no abdominal tenderness.  Musculoskeletal:     Cervical back: Neck supple.     Right lower leg: No edema.     Left lower leg: No edema.  Lymphadenopathy:     Cervical: No cervical adenopathy.  Skin:    General: Skin is warm.     Findings: No rash.   Neurological:     Mental Status: He is alert and oriented to person, place, and time.     Comments: President--- "Zoila Shutter, Bush----Clinton---?" [id number] Recall 3/3  Psychiatric:        Mood and Affect: Mood normal.        Behavior: Behavior normal.  Assessment & Plan:

## 2021-03-21 NOTE — Assessment & Plan Note (Signed)
Will check A1c also

## 2021-03-21 NOTE — Assessment & Plan Note (Signed)
See social history 

## 2021-03-21 NOTE — Assessment & Plan Note (Addendum)
I have personally reviewed the Medicare Annual Wellness questionnaire and have noted 1. The patient's medical and social history 2. Their use of alcohol, tobacco or illicit drugs 3. Their current medications and supplements 4. The patient's functional ability including ADL's, fall risks, home safety risks and hearing or visual             impairment. 5. Diet and physical activities 6. Evidence for depression or mood disorders  The patients weight, height, BMI and visual acuity have been recorded in the chart I have made referrals, counseling and provided education to the patient based review of the above and I have provided the pt with a written personalized care plan for preventive services.  I have provided you with a copy of your personalized plan for preventive services. Please take the time to review along with your updated medication list.  Healthy---has started a program of regular exercise Will be working part time---I think this will be good for him Recommended COVID booster Flu vaccine in the fall Recommended shingrix at the pharmacy Due for colon--contacted Dr Carlean Purl about this Defer PSA to next year

## 2021-03-21 NOTE — Assessment & Plan Note (Signed)
BP Readings from Last 3 Encounters:  03/21/21 140/90  11/04/20 (!) 150/90  03/13/20 140/82   Adequate control on the amlodipine, carvedilol, HCTZ and ramipril Will check labs

## 2021-03-21 NOTE — Assessment & Plan Note (Signed)
Knee---right hand Happy with oral CBD oil

## 2021-03-26 ENCOUNTER — Encounter: Payer: Self-pay | Admitting: Internal Medicine

## 2021-03-27 ENCOUNTER — Other Ambulatory Visit: Payer: Self-pay

## 2021-03-27 MED ORDER — METFORMIN HCL ER 500 MG PO TB24: 500.0000 mg | ORAL_TABLET | Freq: Every day | ORAL | 3 refills | Status: DC

## 2021-06-03 DIAGNOSIS — M5416 Radiculopathy, lumbar region: Secondary | ICD-10-CM | POA: Diagnosis not present

## 2021-06-03 DIAGNOSIS — M549 Dorsalgia, unspecified: Secondary | ICD-10-CM | POA: Diagnosis not present

## 2021-07-03 ENCOUNTER — Telehealth: Payer: Self-pay

## 2021-07-03 ENCOUNTER — Encounter: Payer: Self-pay | Admitting: Internal Medicine

## 2021-07-03 NOTE — Telephone Encounter (Signed)
Pre-visit and colon appointments have been set up for [name]. He requested to do this in the fall on a Friday when a driver is available. We are mailing him out paperwork with the date/times of his appointments.

## 2021-07-03 NOTE — Telephone Encounter (Signed)
-----   Message from Gatha Mayer, MD sent at 07/02/2021  8:37 PM EDT ----- Regarding: previst + colonoscopy Please contact him and tell him Dr. Silvio Pate asked that we set up a screening colonoscopy  And set it up  Thanks

## 2021-07-31 ENCOUNTER — Other Ambulatory Visit: Payer: Self-pay | Admitting: Cardiovascular Disease

## 2021-08-05 ENCOUNTER — Other Ambulatory Visit: Payer: Self-pay | Admitting: Cardiovascular Disease

## 2021-09-19 ENCOUNTER — Other Ambulatory Visit: Payer: Self-pay

## 2021-09-19 ENCOUNTER — Ambulatory Visit (AMBULATORY_SURGERY_CENTER): Payer: Medicare HMO | Admitting: *Deleted

## 2021-09-19 ENCOUNTER — Encounter: Payer: Self-pay | Admitting: Internal Medicine

## 2021-09-19 VITALS — Ht 65.0 in | Wt 179.0 lb

## 2021-09-19 DIAGNOSIS — Z1211 Encounter for screening for malignant neoplasm of colon: Secondary | ICD-10-CM

## 2021-09-19 MED ORDER — NA SULFATE-K SULFATE-MG SULF 17.5-3.13-1.6 GM/177ML PO SOLN: 1.0000 | Freq: Once | ORAL | 0 refills | Status: AC

## 2021-09-19 NOTE — Progress Notes (Signed)
Pt verified name, DOB, address and insurance during PV today.  Pt mailed instruction packet of Emmi video, copy of consent form to read and not return, and instructions.  PV completed over the phone.  Pt encouraged to call with questions or issues.  My Chart instructions to pt as well    No egg or soy allergy known to patient  No issues known to pt with past sedation with any surgeries or procedures Patient denies ever being told they had issues or difficulty with intubation  No FH of Malignant Hyperthermia Pt is not on diet pills Pt is not on  home 02  Pt is not on blood thinners  Pt denies issues with constipation  No A fib or A flutter   Pt is fully vaccinated  for Covid   Due to the COVID-19 pandemic we are asking patients to follow certain guidelines.  Pt aware of COVID protocols and [facility] guidelines

## 2021-10-03 ENCOUNTER — Ambulatory Visit (AMBULATORY_SURGERY_CENTER): Payer: Medicare HMO | Admitting: Internal Medicine

## 2021-10-03 ENCOUNTER — Other Ambulatory Visit: Payer: Self-pay

## 2021-10-03 ENCOUNTER — Encounter: Payer: Self-pay | Admitting: Internal Medicine

## 2021-10-03 VITALS — BP 182/100 | HR 63 | Temp 97.5°F | Resp 18 | Ht 64.0 in | Wt 179.0 lb

## 2021-10-03 DIAGNOSIS — D123 Benign neoplasm of transverse colon: Secondary | ICD-10-CM

## 2021-10-03 DIAGNOSIS — D120 Benign neoplasm of cecum: Secondary | ICD-10-CM | POA: Diagnosis not present

## 2021-10-03 DIAGNOSIS — D122 Benign neoplasm of ascending colon: Secondary | ICD-10-CM

## 2021-10-03 DIAGNOSIS — Z1211 Encounter for screening for malignant neoplasm of colon: Secondary | ICD-10-CM | POA: Diagnosis not present

## 2021-10-03 MED ORDER — SODIUM CHLORIDE 0.9 % IV SOLN: 500.0000 mL | Freq: Once | INTRAVENOUS | Status: DC

## 2021-10-03 NOTE — Progress Notes (Signed)
Pt's states no medical or surgical changes since previsit or office visit. 

## 2021-10-03 NOTE — Progress Notes (Signed)
Pt Drowsy. VSS. To PACU, report to RN. No anesthetic complications noted.  

## 2021-10-03 NOTE — Op Note (Signed)
[facility] Patient Name: [name] Procedure Date: 10/03/2021 10:59 AM MRN: [id number] Endoscopist: Gatha Mayer , MD Age: 65 Referring MD:  Date of Birth: August 30, 1956 Gender: Male Account #: [id number] Procedure:                Colonoscopy Indications:              Screening for colorectal malignant neoplasm, Last                            colonoscopy: 2011 Medicines:                Propofol per Anesthesia, Monitored Anesthesia Care Procedure:                Pre-Anesthesia Assessment:                           - Prior to the procedure, a History and Physical                            was performed, and patient medications and                            allergies were reviewed. The patient's tolerance of                            previous anesthesia was also reviewed. The risks                            and benefits of the procedure and the sedation                            options and risks were discussed with the patient.                            All questions were answered, and informed consent                            was obtained. Prior Anticoagulants: The patient has                            taken no previous anticoagulant or antiplatelet                            agents. ASA Grade Assessment: II - A patient with                            mild systemic disease. After reviewing the risks                            and benefits, the patient was deemed in                            satisfactory condition to undergo the procedure.  After obtaining informed consent, the colonoscope                            was passed under direct vision. Throughout the                            procedure, the patient's blood pressure, pulse, and                            oxygen saturations were monitored continuously. The                            Olympus CF-[id number]) Colonoscope was                            introduced through the  anus and advanced to the the                            cecum, identified by appendiceal orifice and                            ileocecal valve. The colonoscopy was performed                            without difficulty. The patient tolerated the                            procedure well. The quality of the bowel                            preparation was good. The ileocecal valve,                            appendiceal orifice, and rectum were photographed. Scope In: 11:14:42 AM Scope Out: 11:34:22 AM Scope Withdrawal Time: 0 hours 18 minutes 3 seconds  Total Procedure Duration: 0 hours 19 minutes 40 seconds  Findings:                 The perianal and digital rectal examinations were                            normal.                           Five sessile polyps were found in the transverse                            colon, ascending colon and cecum. The polyps were                            diminutive in size. These polyps were removed with                            a cold snare. Resection and retrieval were  complete. Verification of patient identification                            for the specimen was done. Estimated blood loss was                            minimal.                           The exam was otherwise without abnormality on                            direct and retroflexion views. Complications:            No immediate complications. Estimated Blood Loss:     Estimated blood loss was minimal. Impression:               - Five diminutive polyps in the transverse colon,                            in the ascending colon and in the cecum, removed                            with a cold snare. Resected and retrieved.                           - The examination was otherwise normal on direct                            and retroflexion views. Recommendation:           - Patient has a contact number available for                             emergencies. The signs and symptoms of potential                            delayed complications were discussed with the                            patient. Return to normal activities tomorrow.                            Written discharge instructions were provided to the                            patient.                           - Resume previous diet.                           - Continue present medications.                           - Repeat colonoscopy is recommended. The  colonoscopy date will be determined after pathology                            results from today's exam become available for                            review. Gatha Mayer, MD 10/03/2021 11:40:36 AM This report has been signed electronically.

## 2021-10-03 NOTE — Patient Instructions (Addendum)
I found and removed 5 tiny polyps. I will let you know pathology results and when to have another routine colonoscopy by mail and/or My Chart.  I appreciate the opportunity to care for you. Gatha Mayer, MD, FACG   YOU HAD AN ENDOSCOPIC PROCEDURE TODAY AT [facility]:   Refer to the procedure report that was given to you for any specific questions about what was found during the examination.  If the procedure report does not answer your questions, please call your gastroenterologist to clarify.  If you requested that your care partner not be given the details of your procedure findings, then the procedure report has been included in a sealed envelope for you to review at your convenience later.  YOU SHOULD EXPECT: Some feelings of bloating in the abdomen. Passage of more gas than usual.  Walking can help get rid of the air that was put into your GI tract during the procedure and reduce the bloating. If you had a lower endoscopy (such as a colonoscopy or flexible sigmoidoscopy) you may notice spotting of blood in your stool or on the toilet paper. If you underwent a bowel prep for your procedure, you may not have a normal bowel movement for a few days.  Please Note:  You might notice some irritation and congestion in your nose or some drainage.  This is from the oxygen used during your procedure.  There is no need for concern and it should clear up in a day or so.  SYMPTOMS TO REPORT IMMEDIATELY:  Following lower endoscopy (colonoscopy or flexible sigmoidoscopy):  Excessive amounts of blood in the stool  Significant tenderness or worsening of abdominal pains  Swelling of the abdomen that is new, acute  Fever of 100F or higher  For urgent or emergent issues, a gastroenterologist can be reached at any hour by calling [phone]. Do not use MyChart messaging for urgent concerns.    DIET:  We do recommend a small meal at first, but then you may proceed to your regular  diet.  Drink plenty of fluids but you should avoid alcoholic beverages for 24 hours.  ACTIVITY:  You should plan to take it easy for the rest of today and you should NOT DRIVE or use heavy machinery until tomorrow (because of the sedation medicines used during the test).    FOLLOW UP: Our staff will call the number listed on your records 48-72 hours following your procedure to check on you and address any questions or concerns that you may have regarding the information given to you following your procedure. If we do not reach you, we will leave a message.  We will attempt to reach you two times.  During this call, we will ask if you have developed any symptoms of COVID 19. If you develop any symptoms (ie: fever, flu-like symptoms, shortness of breath, cough etc.) before then, please call [phone].  If you test positive for Covid 19 in the 2 weeks post procedure, please call and report this information to [place].    If any biopsies were taken you will be contacted by phone or by letter within the next 1-3 weeks.  Please call us at [phone] if you have not heard about the biopsies in 3 weeks.    SIGNATURES/CONFIDENTIALITY: You and/or your care partner have signed paperwork which will be entered into your electronic medical record.  These signatures attest to the fact that that the information above on your After Visit Summary  has been reviewed and is understood.  Full responsibility of the confidentiality of this discharge information lies with you and/or your care-partner.  

## 2021-10-03 NOTE — Progress Notes (Signed)
Called to room to assist during endoscopic procedure.  Patient ID and intended procedure confirmed with present staff. Received instructions for my participation in the procedure from the performing physician.  

## 2021-10-03 NOTE — Progress Notes (Signed)
[facility] History and Physical   Primary Care Physician:  Venia Carbon, MD   Reason for Procedure:   Colon cancer screening  Plan:    colonoscopy     HPI: [name] is a 65 y.o. male here for colon cancer screening   Past Medical History:  Diagnosis Date   CAD (coronary artery disease)    post stent X 3 to LAD   Coronary atherosclerosis of native coronary artery 10/08/2007   Qualifier: Diagnosis of  By: Danelle Earthly CMA, Darlene     Dyslipidemia    Essential hypertension 10/07/2007   Qualifier: Diagnosis of  By: Wynona Luna    HEMOCCULT POSITIVE STOOL 06/17/2010   Qualifier: Diagnosis of  By: Wynona Luna    Hyperglycemia 03/20/2010   Qualifier: Diagnosis of  By: Wynona Luna    Hyperlipidemia 10/08/2007   Qualifier: Diagnosis of  By: Danelle Earthly CMA, Darlene     Hypertension    Osteoarthritis of both knees 07/16/2008   Qualifier: Diagnosis of  By: Wynona Luna    Prediabetes    on metformin once a day   Unspecified disorder of prostate 07/29/2010   Qualifier: Diagnosis of  By: Wynona Luna     Past Surgical History:  Procedure Laterality Date   COLONOSCOPY  2011   CORONARY STENT PLACEMENT  2002   EYE MUSCLE SURGERY     as child  3rd grade    Prior to Admission medications   Medication Sig Start Date End Date Taking? Authorizing Provider  amLODipine (NORVASC) 10 MG tablet TAKE 1 TABLET BY MOUTH EVERY DAY 01/17/21   Sherren Mocha, MD  aspirin 81 MG tablet Take 81 mg by mouth daily.    [provider]  carvedilol (COREG) 6.25 MG tablet TAKE 1 TABLET BY MOUTH 2 TIMES DAILY WITH A MEAL. 12/16/20   Sherren Mocha, MD  Cholecalciferol (VITAMIN D) 50 MCG (2000 UT) tablet Take 2,000 Units by mouth daily.    [provider]  hydrochlorothiazide (HYDRODIURIL) 25 MG tablet Take 1 tablet (25 mg total) by mouth daily. 11/26/20   Sherren Mocha, MD  metFORMIN (GLUCOPHAGE-XR) 500 MG 24 hr tablet Take 1 tablet (500 mg  total) by mouth daily with breakfast. 03/27/21   Venia Carbon, MD  Multiple Vitamin (MULTIVITAMIN) tablet Take 1 tablet by mouth daily.    [provider]  NON FORMULARY CBD Oil    [provider]  OVER THE COUNTER MEDICATION Omega Q plus Resveratrol and Turmeric    [provider]  ramipril (ALTACE) 10 MG capsule TAKE 1 CAPSULE BY MOUTH TWICE A DAY PLEASE KEEP APPT FOR FUTURE REFILLS 12/30/20   Sherren Mocha, MD  rosuvastatin (CRESTOR) 5 MG tablet TAKE 1 TABLET BY MOUTH EVERY DAY Patient not taking: Reported on 09/19/2021 08/05/21   Sherren Mocha, MD    Current Outpatient Medications  Medication Sig Dispense Refill   amLODipine (NORVASC) 10 MG tablet TAKE 1 TABLET BY MOUTH EVERY DAY 90 tablet 3   aspirin 81 MG tablet Take 81 mg by mouth daily.     carvedilol (COREG) 6.25 MG tablet TAKE 1 TABLET BY MOUTH 2 TIMES DAILY WITH A MEAL. 180 tablet 3   Cholecalciferol (VITAMIN D) 50 MCG (2000 UT) tablet Take 2,000 Units by mouth daily.     hydrochlorothiazide (HYDRODIURIL) 25 MG tablet Take 1 tablet (25 mg total) by mouth daily. 90 tablet 3   metFORMIN (GLUCOPHAGE-XR) 500  MG 24 hr tablet Take 1 tablet (500 mg total) by mouth daily with breakfast. 90 tablet 3   Multiple Vitamin (MULTIVITAMIN) tablet Take 1 tablet by mouth daily.     NON FORMULARY CBD Oil     OVER THE COUNTER MEDICATION Omega Q plus Resveratrol and Turmeric     ramipril (ALTACE) 10 MG capsule TAKE 1 CAPSULE BY MOUTH TWICE A DAY PLEASE KEEP APPT FOR FUTURE REFILLS 180 capsule 3   rosuvastatin (CRESTOR) 5 MG tablet TAKE 1 TABLET BY MOUTH EVERY DAY (Patient not taking: Reported on 09/19/2021) 90 tablet 0   Current Facility-Administered Medications  Medication Dose Route Frequency Provider Last Rate Last Admin   0.9 %  sodium chloride infusion  500 mL Intravenous Once Gatha Mayer, MD        Allergies as of 10/03/2021 - Review Complete 10/03/2021  Allergen Reaction Noted   Atorvastatin  01/16/2011    Tribenzor [olmesartan-amlodipine-hctz] Other (See Comments) 09/29/2013    Family History  Problem Relation Age of Onset   Coronary artery disease Mother    Heart attack Mother        early 35's   Diabetes Mother    Hypertension Mother    Cancer Father        lung    Colon cancer Neg Hx    Colon polyps Neg Hx    Esophageal cancer Neg Hx    Rectal cancer Neg Hx    Stomach cancer Neg Hx     Social History   Socioeconomic History   Marital status: Married    Spouse name: Not on file   Number of children: 3   Years of education: Not on file   Highest education level: Not on file  Occupational History   Occupation: Dealer for Administrator, 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  Tobacco Use   Smoking status: Former    Types: Cigarettes    Start date: 01/29/1974    Quit date: 12/28/1978    Years since quitting: 42.7   Smokeless tobacco: Former    Quit date: 01/30/1984  Substance and Sexual Activity   Alcohol use: No   Drug use: No   Sexual activity: Not on file  Other Topics Concern   Not on file  Social History Narrative   Divorced and remarried 1990's   1 son from first marriage, 2 from second      No living will   Would want wife to be health care POA--- children are alternate   Would accept resuscitation   Not sure about tube feeds    Review of Systems:  All other review of systems negative except as mentioned in the HPI.  Physical Exam: Vital signs BP (!) 164/84   Pulse 72   Temp (!) 97.5 F (36.4 C) (Temporal)   Resp 20   Ht 5\' 4"  (1.626 m)   Wt 179 lb (81.2 kg)   SpO2 95%   BMI 30.73 kg/m   General:   Alert,  Well-developed, well-nourished, pleasant and cooperative in NAD Lungs:  Clear throughout to auscultation.   Heart:  Regular rate and rhythm; no murmurs, clicks, rubs,  or gallops. Abdomen:  Soft, nontender and nondistended. Normal bowel sounds.   Neuro/Psych:  Alert and cooperative. Normal mood and affect. A and O x  3   @Auna Mikkelsen  Simonne Maffucci, MD, [facility] [phone] (pager) 10/03/2021 11:07 AM@

## 2021-10-07 ENCOUNTER — Telehealth: Payer: Self-pay

## 2021-10-07 NOTE — Telephone Encounter (Signed)
  Follow up Call-  Call back number 10/03/2021  Post procedure Call Back phone  # [phone]  Permission to leave phone message Yes  Some recent data might be hidden     2nd follow up call made.  NALM

## 2021-10-07 NOTE — Telephone Encounter (Signed)
Left message on follow up call. 

## 2021-10-08 ENCOUNTER — Encounter: Payer: Self-pay | Admitting: Internal Medicine

## 2021-10-08 DIAGNOSIS — Z860101 Personal history of adenomatous and serrated colon polyps: Secondary | ICD-10-CM

## 2021-10-08 DIAGNOSIS — Z86010 Personal history of colonic polyps: Secondary | ICD-10-CM

## 2021-10-08 HISTORY — DX: Personal history of adenomatous and serrated colon polyps: Z86.0101

## 2021-10-08 HISTORY — DX: Personal history of colonic polyps: Z86.010

## 2021-11-28 ENCOUNTER — Other Ambulatory Visit: Payer: Self-pay | Admitting: Cardiovascular Disease

## 2021-12-11 ENCOUNTER — Other Ambulatory Visit: Payer: Self-pay | Admitting: Cardiovascular Disease

## 2021-12-11 DIAGNOSIS — E785 Hyperlipidemia, unspecified: Secondary | ICD-10-CM

## 2021-12-11 DIAGNOSIS — I2510 Atherosclerotic heart disease of native coronary artery without angina pectoris: Secondary | ICD-10-CM

## 2021-12-11 DIAGNOSIS — I10 Essential (primary) hypertension: Secondary | ICD-10-CM

## 2021-12-22 ENCOUNTER — Other Ambulatory Visit: Payer: Self-pay | Admitting: Cardiovascular Disease

## 2022-01-02 ENCOUNTER — Encounter: Payer: Self-pay | Admitting: Physician Assistant

## 2022-01-02 NOTE — Progress Notes (Signed)
Cardiology Office Note    Date:  01/07/2022   ID:  [name], DOB 15-Mar-1956, MRN [id number]  PCP:  Venia Carbon, MD  Cardiologist:  Sherren Mocha, MD  Electrophysiologist:  None   Chief Complaint: f/u CAD  History of Present Illness:   [name] is a 66 y.o. male with history of CAD, HTN (long hx of whitecoat syndrome), HLD with prior statin intolerance who is seen for routine follow-up. He had remote abnormal stress testing and underwent cath in 2002 s/p 3 DES to LAD, report outlined below. EF was 65% with mild apical HK at that time. Otherwise there was residual 80% superior ramus and 75% OM1. He also has a history of whitecoat component to his HTN with prior ambulatory BP monitor in 2017 showing average BP 131/82. He also has hx of statin intolerance on atorvastatin and rosuvastatin and has not been interested in additional therapy such as ezetimibe or PCSK9i in the past. At last OV he was only able to take Crestor 1x per week due to pain in his hands. Hyperglycemia listed in PMH but A1Cs normal by last several years.  He is seen back for follow-up doing well without any chest pain or dyspnea. He exercises several times a week without any angina. No palpitations or syncope. His BP is high today. Recheck by me 154/92. He states it was ranging from 147/82 to 160/87 this AM at home. He is extremely hesitant to advance his medication regimen.  Labwork independently reviewed: 02/2021 A1C 5.5, CBC wnl, LDL 98, trig 161, K 3.8, Cr 0.88, ALT 80, AST wnl   Cardiology Studies:   Studies reviewed are outlined and summarized above. Reports included below if pertinent.   Amb BP monitor 2017 - see scan and summary above  Diagnostic Cath 10/2001 Cardiac Catheterization   DATE OF BIRTH:  03/02/1956.   REASON FOR PRESENTATION:  Evaluate patient with an abnormal Cardiolite suggesting anterior ischemia.   PROCEDURAL NOTE:  Left heart catheterization was performed via the  right femoral artery.  The artery was cannulated using anterior wall puncture.  A 6-French arterial sheath was inserted via the modified Seldinger technique. Preformed Judkins and a pigtail catheter were utilized.  The patient tolerated the procedure well.   RESULTS:   Hemodynamics:  LV 156/96, AO 155/12.   Coronaries: 1. The left main was normal. 2. The LAD had proximal 25% stenosis followed by a shelf-like 95% before the    first septal perforator.  There was mid 99% stenosis followed by a 75-80%    lesion.  There was TIMI-2 flow in the LAD following the mid 99% lesion.    There were right-to-left collaterals. 3. Circumflex:  The circumflex was a large vessel.  There were luminal    irregularities in the AV groove.  The ramus intermedius was a very large    vessel.  The superior branch had a proximal 80% stenosis.  OM-1 had a    proximal 75% stenosis. 4. Right coronary artery:  The right coronary artery was dominant with    luminal irregularities.  Again, there were right-to-left collaterals.   Left ventriculogram:  A left ventriculogram was obtained in the RAO projection.  The EF was 65% with very mild apical hypokinesis.   CONCLUSIONS:  Severe left anterior descending artery stenosis with high-grade circumflex (ramus intermedius) lesions.   PLAN:  The patient will have percutaneous revascularization of the LAD.  Will consider medical management versus revascularization electively  particularly of the ramus intermedius branch. Dictated by:   Minus Breeding, M.D. [place] Attending Physician:  Fay Records DD:  10/07/01 TD:  10/07/01 Job: [id number]  Interventional Cath 09/2001 Cardiac Catheterization   PROCEDURE PERFORMED: Percutaneous transluminal coronary angioplasty and stenting of the mid and distal left anterior descending.   DIAGNOSIS: Severe single-vessel coronary artery disease.   INDICATIONS: The patient is a 66 year old, white male, without prior  cardiac history, who presents with progressive substernal chest discomfort.  The patient underwent stress imaging study which showed ischemia in the anterior wall and he underwent cardiac catheterization by Dr. Percival Spanish earlier today showing severe single-vessel coronary artery disease with lesions in the mid and distal LAD. He has well preserved LV function.  He presents now for percutaneous intervention.   TECHNIQUE: Informed consent was obtained. An existing 6 French sheath was exchanged for a 7 [place] sheath. The patient was given heparin and Integrilin on a weight-adjusted basis to maintain ACT of greater than 250 seconds. In addition he was given Plavix 300 mg orally. A 7 French Q 3.5 guide catheter was used to engage the left coronary artery and selective guide shots using manual injections of contrast. This showed a high-grade plaque of 70% in the mid LAD and immediately after the first diagonal branch there was a further subtotal narrowing of 99% in the mid section following by a high-grade tubular narrowing of 70% in the distal section.  A 0.14 inch extra-support wire was then advanced to the distal LAD and a 3.0 x 10 mm Cutting Balloon introduced. This was used to dilate the distal lesion at 6 atmospheres at 30 seconds x2 inflations, the mid lesion at 6 atmospheres times 30 seconds for 2 inflations as well as the proximal mid lesion at 10 atmospheres for 30 seconds x2 lesions.  Repeat angiography showed only mild improvement in the vessel lumen in the distal lesion. There was mild improvement in lumen but residual haziness in the mid lesion and only modest improvement of lumen in the proximal lesion. A 3.0 x 12 mm Express II stent was then introduced to the mid lesion deployed at 12 atmospheres for 60 seconds. A 3.5 x 16 mm Express II was placed in the proximal lesion and deployed at 14 atmospheres for 60 seconds. Repeat angiography showed significant improvement in these vessel  lumen with no residual stenosis.  However, there was now evidence of a dissection in the distal lesion which was previously unappreciated. A 3.0 x 12 mm Express II was then positioned in the distal LAD and deployed at 12 atmospheres for 30 seconds. Repeat angiography was now performed after the administration of intracoronary nitroglycerin showing excellent result with no residual stenosis in the area of stenting. There was moderate residual disease of 30% in the mid LAD and improvement from TIMI grade 2 to TIMI grade 3 flow. Final angiography was performed in various projections, confirmed these findings.  The guide catheter was then removed and the sheath secured in position. The patient tolerated the procedure well.   FINAL RESULTS: 1. Successful percutaneous transluminal coronary angioplasty and stenting of    the mid left anterior descending immediately after the first diagonal    branch with reduction of 70% eccentric narrowing to 0% with placement of    a 3.5 x 16 mm Express II. 2. Successful percutaneous transluminal coronary angioplasty and stenting of    of the mid left anterior descending with reduction of subtotal 99%    narrowing to 0%  with placement of a 3.0 x 12 mm Express II. 3. Successful percutaneous transluminal coronary angioplasty and stenting of    the distal left anterior descending with reduction of 70% narrowing to 0%    with placement of a 3.0 x 12 mm Express II stent. Dictated by:   Christy Sartorius, M.D. Attending Physician:  Fay Records DD:  10/07/01 TD:  10/08/01 Job: [phone] [id number]     Past Medical History:  Diagnosis Date   CAD (coronary artery disease)    post stent X 3 to LAD   Essential hypertension 10/07/2007   Qualifier: Diagnosis of  By: Wynona Luna    HEMOCCULT POSITIVE STOOL 06/17/2010   Qualifier: Diagnosis of  By: Wynona Luna    Hx of adenomatous colonic polyps 10/08/2021   Hyperglycemia 03/20/2010   Qualifier: Diagnosis of   By: Wynona Luna    Hyperlipidemia 10/08/2007   Qualifier: Diagnosis of  By: Danelle Earthly CMA, Darlene     Hypertension    Osteoarthritis of both knees 07/16/2008   Qualifier: Diagnosis of  By: Wynona Luna    Prediabetes    on metformin once a day   Statin intolerance    Unspecified disorder of prostate 07/29/2010   Qualifier: Diagnosis of  By: Wynona Luna     Past Surgical History:  Procedure Laterality Date   COLONOSCOPY  2011   CORONARY STENT PLACEMENT  2002   EYE MUSCLE SURGERY     as child  3rd grade    Current Medications: Current Meds  Medication Sig   amLODipine (NORVASC) 10 MG tablet TAKE 1 TABLET BY MOUTH EVERY DAY   aspirin 81 MG tablet Take 81 mg by mouth daily.   carvedilol (COREG) 6.25 MG tablet Take 1 tablet (6.25 mg total) by mouth 2 (two) times daily with a meal. Please keep appt. In Jan. With Cardiologist in order to receive further refills. Thank you,   Cholecalciferol (VITAMIN D) 50 MCG (2000 UT) tablet Take 2,000 Units by mouth daily.   hydrochlorothiazide (HYDRODIURIL) 25 MG tablet Take 1 tablet (25 mg total) by mouth daily. Please keep upcoming appt in January 2023 with Cardiologist before anymore refills. Thank you   metFORMIN (GLUCOPHAGE-XR) 500 MG 24 hr tablet Take 1 tablet (500 mg total) by mouth daily with breakfast.   Multiple Vitamin (MULTIVITAMIN) tablet Take 1 tablet by mouth daily.   NON FORMULARY CBD Oil   OVER THE COUNTER MEDICATION Omega Q plus Resveratrol and Turmeric   ramipril (ALTACE) 10 MG capsule TAKE 1 CAPSULE BY MOUTH TWICE A DAY PLEASE KEEP APPT FOR FUTURE REFILLS     Allergies:   Atorvastatin and Tribenzor [olmesartan-amlodipine-hctz]   Social History   Socioeconomic History   Marital status: Married    Spouse name: Not on file   Number of children: 3   Years of education: Not on file   Highest education level: Not on file  Occupational History   Occupation: Dealer for [employer], [employer]ellery    Employer: [employer]: Retired  Tobacco Use   Smoking status: Former    Types: Cigarettes    Start date: 01/29/1974    Quit date: 12/28/1978    Years since quitting: 43.0   Smokeless tobacco: Former    Quit date: 01/30/1984  Substance and Sexual Activity   Alcohol use: No   Drug use: No   Sexual activity: Not on file  Other  Topics Concern   Not on file  Social History Narrative   Divorced and remarried 1990's   1 son from first marriage, 2 from second      No living will   Would want wife to be health care POA--- children are alternate   Would accept resuscitation   Not sure about tube feeds   Social Determinants of Health   Financial Resource Strain: Not on file  Food Insecurity: Not on file  Transportation Needs: Not on file  Physical Activity: Not on file  Stress: Not on file  Social Connections: Not on file     Family History:  The patient's family history includes Cancer in his father; Coronary artery disease in his mother; Diabetes in his mother; Heart attack in his mother; Hypertension in his mother. There is no history of Colon cancer, Colon polyps, Esophageal cancer, Rectal cancer, or Stomach cancer.  ROS:   Please see the history of present illness.  All other systems are reviewed and otherwise negative.    EKG(s)/Additional Labs   EKG:  EKG is ordered today, personally reviewed, demonstrating NSR 65bpm, nonspecific STTW changes similar to prior.  Recent Labs: 03/21/2021: ALT 80; BUN 18; Creatinine, Ser 0.88; Hemoglobin 16.3; Platelets 189.0; Potassium 3.8; Sodium 139  Recent Lipid Panel    Component Value Date/Time   CHOL 169 03/21/2021 1020   CHOL 170 02/18/2017 0734   TRIG 161.0 (H) 03/21/2021 1020   TRIG 79 01/03/2007 0737   HDL 38.30 (L) 03/21/2021 1020   HDL 33 (L) 02/18/2017 0734   CHOLHDL 4 03/21/2021 1020   VLDL 32.2 03/21/2021 1020   LDLCALC 98 03/21/2021 1020   LDLCALC 100 (H) 02/18/2017 0734   LDLDIRECT 96.0 01/06/2019 1035    PHYSICAL  EXAM:    VS:  BP (!) 170/100    Pulse 65    Ht 5\' 6"  (1.676 m)    Wt 177 lb 3.2 oz (80.4 kg)    SpO2 96%    BMI 28.60 kg/m   BMI: Body mass index is 28.6 kg/m.  GEN: Well nourished, well developed male in no acute distress HEENT: normocephalic, atraumatic Neck: no JVD, carotid bruits, or masses Cardiac: RRR; no murmurs, rubs, or gallops, no edema  Respiratory:  clear to auscultation bilaterally, normal work of breathing GI: soft, nontender, nondistended, + BS MS: no deformity or atrophy Skin: warm and dry, no rash Neuro:  Alert and Oriented x 3, Strength and sensation are intact, follows commands Psych: euthymic mood, full affect  GN. Wt Readings from Last 3 Encounters:  01/07/22 177 lb 3.2 oz (80.4 kg)  10/03/21 179 lb (81.2 kg)  09/19/21 179 lb (81.2 kg)     ASSESSMENT & PLAN:   1. CAD - doing well without angina. Continue ASA, BB. He is only willing to take rosuvastatin once weekly at this point due to joint pain in his hands. He previously had myalgias with increased dosing as well as atorvastatin. Although I think he is clinically doing well from a cardiac standpoint we had a long discussion about the importance of controlling the risk factors we know are implicated in risk of future cardiovascular events I.e. BP and cholesterol. He declines referral to lipid clinic at this time and does not wish to make any changes to his BP regimen. Update labs while he is here - CBC, CMET, lipid profile.  2. Essential HTN - historically with significant whitecoat HTN component, but also with elevated BPs by home readings today as  well. He is on amlodipine, HCTZ, ramipiril and carvedilol. We discussed titration of carvedilol vs 24 ambulatory BP monitor. He would prefer to follow his BP at home over the next week and relay results to our office in 1 week. If BP is >130/80 by readings would suggest increasing carvedilol to 12.5mg  BID with plan for patient to relay home BP response after titration.  Check thyroid with labs.  3. Hyperlipidemia with h/o statin intolerance - he prefers to continue rosuvastatin 5mg  once weekly, no more often. We discussed Zetia, PCSK9i injections and referral to pharmD clinic. He declines at this time but will give it more thought and let us know if he changes his mind. Check CMET, lipids today.  4. Elevated ALT - noted on prior labs. Recheck today.    Disposition: F/u with Dr. Burt Knack in 1 year.    Medication Adjustments/Labs and Tests Ordered: Current medicines are reviewed at length with the patient today.  Concerns regarding medicines are outlined above. Medication changes, Labs and Tests ordered today are summarized above and listed in the Patient Instructions accessible in Encounters.   Signed, Charlie Pitter, PA-C  01/07/2022 9:09 AM    [place] Phone: [phone]; Fax: [phone]

## 2022-01-07 ENCOUNTER — Ambulatory Visit: Payer: Medicare HMO | Admitting: Physician Assistant

## 2022-01-07 ENCOUNTER — Encounter: Payer: Self-pay | Admitting: Physician Assistant

## 2022-01-07 ENCOUNTER — Other Ambulatory Visit: Payer: Self-pay

## 2022-01-07 VITALS — BP 170/100 | HR 65 | Ht 66.0 in | Wt 177.2 lb

## 2022-01-07 DIAGNOSIS — E785 Hyperlipidemia, unspecified: Secondary | ICD-10-CM

## 2022-01-07 DIAGNOSIS — R7401 Elevation of levels of liver transaminase levels: Secondary | ICD-10-CM | POA: Diagnosis not present

## 2022-01-07 DIAGNOSIS — I2510 Atherosclerotic heart disease of native coronary artery without angina pectoris: Secondary | ICD-10-CM

## 2022-01-07 DIAGNOSIS — I10 Essential (primary) hypertension: Secondary | ICD-10-CM | POA: Diagnosis not present

## 2022-01-07 LAB — COMPREHENSIVE METABOLIC PANEL
ALT: 48 IU/L — ABNORMAL HIGH (ref 0–44)
AST: 24 IU/L (ref 0–40)
Albumin/Globulin Ratio: 2.1 (ref 1.2–2.2)
Albumin: 4.8 g/dL (ref 3.8–4.8)
Alkaline Phosphatase: 64 IU/L (ref 44–121)
BUN/Creatinine Ratio: 22 (ref 10–24)
BUN: 22 mg/dL (ref 8–27)
Bilirubin Total: 0.5 mg/dL (ref 0.0–1.2)
CO2: 23 mmol/L (ref 20–29)
Calcium: 9.8 mg/dL (ref 8.6–10.2)
Chloride: 100 mmol/L (ref 96–106)
Creatinine, Ser: 1.02 mg/dL (ref 0.76–1.27)
Globulin, Total: 2.3 g/dL (ref 1.5–4.5)
Glucose: 123 mg/dL — ABNORMAL HIGH (ref 70–99)
Potassium: 3.9 mmol/L (ref 3.5–5.2)
Sodium: 137 mmol/L (ref 134–144)
Total Protein: 7.1 g/dL (ref 6.0–8.5)
eGFR: 81 mL/min/{1.73_m2} (ref >59)

## 2022-01-07 LAB — TSH: TSH: 1.33 u[IU]/mL (ref 0.450–4.500)

## 2022-01-07 LAB — CBC
Hematocrit: 48.2 % (ref 37.5–51.0)
Hemoglobin: 16.3 g/dL (ref 13.0–17.7)
MCH: 29 pg (ref 26.6–33.0)
MCHC: 33.8 g/dL (ref 31.5–35.7)
MCV: 86 fL (ref 79–97)
Platelets: 280 10*3/uL (ref 150–450)
RBC: 5.63 x10E6/uL (ref 4.14–5.80)
RDW: 13.2 % (ref 11.6–15.4)
WBC: 8.8 10*3/uL (ref 3.4–10.8)

## 2022-01-07 LAB — LIPID PANEL
Chol/HDL Ratio: 4.8 ratio (ref 0.0–5.0)
Cholesterol, Total: 162 mg/dL (ref 100–199)
HDL: 34 mg/dL — ABNORMAL LOW (ref >39)
LDL Chol Calc (NIH): 100 mg/dL — ABNORMAL HIGH (ref 0–99)
Triglycerides: 157 mg/dL — ABNORMAL HIGH (ref 0–149)
VLDL Cholesterol Cal: 28 mg/dL (ref 5–40)

## 2022-01-07 MED ORDER — HYDROCHLOROTHIAZIDE 25 MG PO TABS: 25.0000 mg | ORAL_TABLET | Freq: Every day | ORAL | 3 refills | Status: DC

## 2022-01-07 MED ORDER — ROSUVASTATIN CALCIUM 5 MG PO TABS: 5.0000 mg | ORAL_TABLET | ORAL | 3 refills | Status: DC

## 2022-01-07 MED ORDER — CARVEDILOL 6.25 MG PO TABS: 6.2500 mg | ORAL_TABLET | Freq: Two times a day (BID) | ORAL | 3 refills | Status: DC

## 2022-01-07 MED ORDER — ROSUVASTATIN CALCIUM 5 MG PO TABS: 5.0000 mg | ORAL_TABLET | Freq: Every day | ORAL | 0 refills | Status: DC

## 2022-01-07 MED ORDER — AMLODIPINE BESYLATE 10 MG PO TABS: 10.0000 mg | ORAL_TABLET | Freq: Every day | ORAL | 3 refills | Status: DC

## 2022-01-07 MED ORDER — RAMIPRIL 10 MG PO CAPS: ORAL_CAPSULE | ORAL | 3 refills | Status: DC

## 2022-01-07 NOTE — Patient Instructions (Addendum)
Medication Instructions:  Your physician recommends that you continue on your current medications as directed, but have changed the Rosuvastatin prescription to taking 1 time a week Please refer to the Current Medication list given to you today.  *If you need a refill on your cardiac medications before your next appointment, please call your pharmacy*   Lab Work: TODAY:  CMET, LIPID, CBC, & TSH  If you have labs (blood work) drawn today and your tests are completely normal, you will receive your results only by: [place] (if you have MyChart) OR A paper copy in the mail If you have any lab test that is abnormal or we need to change your treatment, we will call you to review the results.   Testing/Procedures: None ordered   Follow-Up: At [facility], you and your health needs are our priority.  As part of our continuing mission to provide you with exceptional heart care, we have created designated Provider Care Teams.  These Care Teams include your primary Cardiologist (physician) and Advanced Practice Providers (APPs -  Physician Assistants and Nurse Practitioners) who all work together to provide you with the care you need, when you need it.  We recommend signing up for the patient portal called "MyChart".  Sign up information is provided on this After Visit Summary.  MyChart is used to connect with patients for Virtual Visits (Telemedicine).  Patients are able to view lab/test results, encounter notes, upcoming appointments, etc.  Non-urgent messages can be sent to your provider as well.   To learn more about what you can do with MyChart, go to [email or web address].    Your next appointment:   12 month(s)  The format for your next appointment:   In Person  Provider:   Sherren Mocha, MD  or Melina Copa, PA-C         Other Instructions We need to get a better idea of what your blood pressure is running at home. Here are some instructions to follow: - I would  recommend using a blood pressure cuff that goes on your arm. The wrist ones can be inaccurate. If you're purchasing one for the first time, try to select one that also reports your heart rate because this can be helpful information as well. - To check your blood pressure, choose a time at least 3 hours after taking your blood pressure medicines. If you can sample it at different times of the day, that's great - it might give you more information about how your blood pressure fluctuates. Remain seated in a chair for 5 minutes quietly beforehand, then check it.  - Please record a list of those readings and call us/send in MyChart message with them for our review in 1 week.

## 2022-01-09 ENCOUNTER — Telehealth: Payer: Self-pay | Admitting: Physician Assistant

## 2022-01-09 NOTE — Telephone Encounter (Signed)
-----   Message from Charlie Pitter, PA-C sent at 01/08/2022  8:25 AM EST ----- Please let Pt know labs overall look good. A few mild abnormalities, blood sugar mildly elevated, one of his liver numbers remains up similar to prior years - would suggest following up with pcp for this, since this cannot be easily explained by such a low dose of statin. It is only marginally elevated. Most common cause is often what we call fatty liver but this may have already been evaluated by PCP in the past. Cholesterol is not at goal but patient knows to contact us if he decides to pursue pharmD lipid clinic to consider statin alternatives.

## 2022-01-09 NOTE — Telephone Encounter (Signed)
    Pt is returning call to get lab results 

## 2022-01-09 NOTE — Telephone Encounter (Signed)
Returned call to pt, left a message for pt to call back.  

## 2022-01-13 ENCOUNTER — Encounter: Payer: Self-pay | Admitting: *Deleted

## 2022-01-14 NOTE — Telephone Encounter (Signed)
Follow Up:      Patient is returning call from [facility] concerning his lab results.

## 2022-01-16 NOTE — Telephone Encounter (Signed)
Returned the call to pt, after several attempts and messages, a letter has been mailed with pt's lab results. Left a detailed message letting pt know this.

## 2022-03-24 ENCOUNTER — Encounter: Payer: Medicare HMO | Admitting: Internal Medicine

## 2022-04-06 ENCOUNTER — Other Ambulatory Visit: Payer: Self-pay | Admitting: Physician Assistant

## 2022-04-22 ENCOUNTER — Encounter: Payer: Self-pay | Admitting: Internal Medicine

## 2022-04-22 ENCOUNTER — Ambulatory Visit (INDEPENDENT_AMBULATORY_CARE_PROVIDER_SITE_OTHER): Payer: Medicare HMO | Admitting: Internal Medicine

## 2022-04-22 VITALS — BP 132/82 | HR 66 | Temp 97.7°F | Ht 64.5 in | Wt 177.0 lb

## 2022-04-22 DIAGNOSIS — I10 Essential (primary) hypertension: Secondary | ICD-10-CM

## 2022-04-22 DIAGNOSIS — I2510 Atherosclerotic heart disease of native coronary artery without angina pectoris: Secondary | ICD-10-CM | POA: Diagnosis not present

## 2022-04-22 DIAGNOSIS — R739 Hyperglycemia, unspecified: Secondary | ICD-10-CM | POA: Diagnosis not present

## 2022-04-22 DIAGNOSIS — M159 Polyosteoarthritis, unspecified: Secondary | ICD-10-CM | POA: Diagnosis not present

## 2022-04-22 DIAGNOSIS — Z125 Encounter for screening for malignant neoplasm of prostate: Secondary | ICD-10-CM | POA: Diagnosis not present

## 2022-04-22 DIAGNOSIS — Z0000 Encounter for general adult medical examination without abnormal findings: Secondary | ICD-10-CM

## 2022-04-22 LAB — PSA, MEDICARE: PSA: 0.43 ng/ml (ref 0.10–4.00)

## 2022-04-22 LAB — HEMOGLOBIN A1C: Hgb A1c MFr Bld: 5.6 % (ref 4.6–6.5)

## 2022-04-22 LAB — GLUCOSE, RANDOM: Glucose, Bld: 117 mg/dL — ABNORMAL HIGH (ref 70–99)

## 2022-04-22 NOTE — Assessment & Plan Note (Signed)
Didn't start the metfromin ?Will check levels again and reconsider ?

## 2022-04-22 NOTE — Assessment & Plan Note (Signed)
I have personally reviewed the Medicare Annual Wellness questionnaire and have noted ?1. The patient's medical and social history ?2. Their use of alcohol, tobacco or illicit drugs ?3. Their current medications and supplements ?4. The patient's functional ability including ADL's, fall risks, home safety risks and hearing or visual ?            impairment. ?5. Diet and physical activities ?6. Evidence for depression or mood disorders ? ?The patients weight, height, BMI and visual acuity have been recorded in the chart ?I have made referrals, counseling and provided education to the patient based review of the above and I have provided the pt with a written personalized care plan for preventive services. ? ?I have provided you with a copy of your personalized plan for preventive services. Please take the time to review along with your updated medication list. ? ?Colon due again 2027 ?Discussed PSA ---will check ?COVID booster and flu vaccine in the fall ?Exercising regularly ?Thinks he had shingrix--will check with pharmacist ?

## 2022-04-22 NOTE — Assessment & Plan Note (Signed)
Quiet on coreg, ramipril, rosuvastatin '5mg'$ , ASA 81  ?

## 2022-04-22 NOTE — Progress Notes (Signed)
Vision Screening   Right eye Left eye Both eyes  Without correction 20/30 20/30 20/30  With correction     Hearing Screening - Comments:: Passed whisper test  

## 2022-04-22 NOTE — Progress Notes (Signed)
? ?Subjective:  ? ? Patient ID: [name], male    DOB: 07-27-1956, 66 y.o.   MRN: [id number] ? ?HPI ?Here for Medicare wellness visit and follow up of chronic health conditions ?Reviewed advanced directives ?Reviewed other doctors---Dr Burt Knack [facility])--cardiology, Dr Carlean Purl --GI ?No hospitalizations or surgery in the past year ?No alcohol or tobacco ?Vision okay--has reading glasses (no eye doctor) ?Hearing is okay ?Continues to go to the gym regularly ?1 fall---no sig injury ?No depression or anhedonia ?Independent with instrumental ADLs ?No sig memory issues ? ?Did go back to work---part time ?But boss wanted him to do more than he wanted (lead position) ? ?No chest pain  ?No SOB ?No dizziness or syncope ?No edema ?No palpitations ? ?Didn't start the metformin for elevated sugar ? ?Mild joint pains ?CBD does help--having trouble finding it ?Working out has helped ? ?Urine flow is slow---especially at night ?Tried OTC  (saw palmetto?) did help some ?Does okay during the day ? ?Current Outpatient Medications on File Prior to Visit  ?Medication Sig Dispense Refill  ? amLODipine (NORVASC) 10 MG tablet Take 1 tablet (10 mg total) by mouth daily. 90 tablet 3  ? aspirin 81 MG tablet Take 81 mg by mouth daily.    ? carvedilol (COREG) 6.25 MG tablet Take 1 tablet (6.25 mg total) by mouth 2 (two) times daily with a meal. 180 tablet 3  ? Cholecalciferol (VITAMIN D) 50 MCG (2000 UT) tablet Take 2,000 Units by mouth daily.    ? hydrochlorothiazide (HYDRODIURIL) 25 MG tablet Take 1 tablet (25 mg total) by mouth daily. 90 tablet 3  ? metFORMIN (GLUCOPHAGE-XR) 500 MG 24 hr tablet Take 1 tablet (500 mg total) by mouth daily with breakfast. 90 tablet 3  ? Multiple Vitamin (MULTIVITAMIN) tablet Take 1 tablet by mouth daily.    ? NON FORMULARY CBD Oil    ? OVER THE COUNTER MEDICATION Omega Q plus Resveratrol and Turmeric    ? ramipril (ALTACE) 10 MG capsule TAKE 1 CAPSULE BY MOUTH TWICE A DAY 180 capsule 3  ? rosuvastatin  (CRESTOR) 5 MG tablet TAKE 1 TABLET (5 MG TOTAL) BY MOUTH DAILY. 90 tablet 2  ? ?No current facility-administered medications on file prior to visit.  ? ? ?Allergies  ?Allergen Reactions  ? Atorvastatin   ?  REACTION: myalgias  ? Tribenzor [Olmesartan-Amlodipine-Hctz] Other (See Comments)  ?  Joint pain  ? ? ?Past Medical History:  ?Diagnosis Date  ? CAD (coronary artery disease)   ? post stent X 3 to LAD  ? Essential hypertension 10/07/2007  ? Qualifier: Diagnosis of  By: Wynona Luna   ? HEMOCCULT POSITIVE STOOL 06/17/2010  ? Qualifier: Diagnosis of  By: Wynona Luna   ? Hx of adenomatous colonic polyps 10/08/2021  ? Hyperglycemia 03/20/2010  ? Qualifier: Diagnosis of  By: Wynona Luna   ? Hyperlipidemia 10/08/2007  ? Qualifier: Diagnosis of  By: Danelle Earthly CMA, Darlene    ? Hypertension   ? Osteoarthritis of both knees 07/16/2008  ? Qualifier: Diagnosis of  By: Wynona Luna   ? Prediabetes   ? on metformin once a day  ? Statin intolerance   ? Unspecified disorder of prostate 07/29/2010  ? Qualifier: Diagnosis of  By: Wynona Luna   ? ? ?Past Surgical History:  ?Procedure Laterality Date  ? COLONOSCOPY  2011  ? CORONARY STENT PLACEMENT  2002  ? EYE MUSCLE SURGERY    ?  as child  3rd grade  ? ? ?Family History  ?Problem Relation Age of Onset  ? Coronary artery disease Mother   ? Heart attack Mother   ?     early 68's  ? Diabetes Mother   ? Hypertension Mother   ? Cancer Father   ?     lung   ? Colon cancer Neg Hx   ? Colon polyps Neg Hx   ? Esophageal cancer Neg Hx   ? Rectal cancer Neg Hx   ? Stomach cancer Neg Hx   ? ? ?Social History  ? ?Socioeconomic History  ? Marital status: Married  ?  Spouse name: Not on file  ? Number of children: 3  ? Years of education: Not on file  ? Highest education level: Not on file  ?Occupational History  ? Occupation: Dealer for air compressors  ?  Employer: [employer]  ?  Comment: Retired  ?Tobacco Use  ? Smoking status: Former  ?  Types:  Cigarettes  ?  Start date: 01/29/1974  ?  Quit date: 12/28/1978  ?  Years since quitting: 43.3  ?  Passive exposure: Past  ? Smokeless tobacco: Former  ?  Quit date: 01/30/1984  ?Substance and Sexual Activity  ? Alcohol use: No  ? Drug use: No  ? Sexual activity: Not on file  ?Other Topics Concern  ? Not on file  ?Social History Narrative  ? Divorced and remarried 1990's  ? 1 son from first marriage, 2 from second  ?   ? No living will  ? Would want wife to be health care POA--- children are alternate  ? Would accept resuscitation  ? Not sure about tube feeds  ? ?Social Determinants of Health  ? ?Financial Resource Strain: Not on file  ?Food Insecurity: Not on file  ?Transportation Needs: Not on file  ?Physical Activity: Not on file  ?Stress: Not on file  ?Social Connections: Not on file  ?Intimate Partner Violence: Not on file  ? ?Review of Systems ?Appetite is good ?Weight is stable ?Sleeps okay---CBD helps  ?Wears seat belt ?Needs dental appointment--needs to get dentist ?No suspicious skin lesions ?No heartburn or dysphagia ?Bowels are fine---no blood ? ?   ?Objective:  ? Physical Exam ?Constitutional:   ?   Appearance: Normal appearance.  ?HENT:  ?   Mouth/Throat:  ?   Comments: No lesions ?Eyes:  ?   Conjunctiva/sclera: Conjunctivae normal.  ?   Pupils: Pupils are equal, round, and reactive to light.  ?Cardiovascular:  ?   Rate and Rhythm: Normal rate and regular rhythm.  ?   Pulses: Normal pulses.  ?   Heart sounds: No murmur heard. ?  No gallop.  ?Pulmonary:  ?   Effort: Pulmonary effort is normal.  ?   Breath sounds: Normal breath sounds. No wheezing or rales.  ?Abdominal:  ?   Palpations: Abdomen is soft.  ?   Tenderness: There is no abdominal tenderness.  ?Musculoskeletal:  ?   Cervical back: Neck supple.  ?   Right lower leg: No edema.  ?   Left lower leg: No edema.  ?Lymphadenopathy:  ?   Cervical: No cervical adenopathy.  ?Skin: ?   Findings: No lesion or rash.  ?Neurological:  ?   General: No focal deficit  present.  ?   Mental Status: He is alert and oriented to person, place, and time.  ?   Comments: Mini-cog normal  ?Psychiatric:     ?  Mood and Affect: Mood normal.     ?   Behavior: Behavior normal.  ?  ? ? ? ? ?   ?Assessment & Plan:  ? ?

## 2022-04-22 NOTE — Assessment & Plan Note (Signed)
BP Readings from Last 3 Encounters:  ?04/22/22 132/82  ?01/07/22 (!) 170/100  ?10/03/21 (!) 182/100  ? ?Controlled with amlodipine 10, carvedilol 6.25 bid, HCTZ 25 and ramipril '10mg'$  daily ?

## 2022-04-22 NOTE — Assessment & Plan Note (Signed)
Does okay with just CBD oil ?

## 2022-06-17 DIAGNOSIS — M25511 Pain in right shoulder: Secondary | ICD-10-CM | POA: Diagnosis not present

## 2022-07-29 DIAGNOSIS — M7541 Impingement syndrome of right shoulder: Secondary | ICD-10-CM | POA: Diagnosis not present

## 2022-08-06 DIAGNOSIS — M25511 Pain in right shoulder: Secondary | ICD-10-CM | POA: Diagnosis not present

## 2022-08-12 DIAGNOSIS — M25511 Pain in right shoulder: Secondary | ICD-10-CM | POA: Diagnosis not present

## 2022-08-13 ENCOUNTER — Telehealth: Payer: Self-pay | Admitting: *Deleted

## 2022-08-13 NOTE — Telephone Encounter (Signed)
   Pre-operative Risk Assessment    Patient Name: [name]  DOB: 04/14/1956 MRN: [id number]      Request for Surgical Clearance    Procedure:   RIGHT SHOULDER SCOPE, POSSIBLE RCR  Date of Surgery:  Clearance 09/25/22                                 Surgeon:  DR. Lennette Bihari SUPPLE Surgeon's Group or Practice Name:  [facility] Phone number:  [phone] Fax number:  [phone]   Type of Clearance Requested:   - Pharmacy:  Hold Aspirin NOT INDICATED   Type of Anesthesia:  General    Additional requests/questions:    [name]   08/13/2022, 3:47 PM

## 2022-08-14 ENCOUNTER — Telehealth: Payer: Self-pay

## 2022-08-14 NOTE — Telephone Encounter (Signed)
First attempt to reach patient to schedule appointment for preop clearance

## 2022-08-14 NOTE — Telephone Encounter (Signed)
Patient is scheduled for tele visit on 08/20/22. Med rec and consent done

## 2022-08-14 NOTE — Telephone Encounter (Signed)
   Name: [name]  DOB: [birth year]  MRN: [id number]  Primary Cardiologist: Sherren Mocha, MD   Preoperative team, please contact this patient and set up a phone call appointment for further preoperative risk assessment. Please obtain consent and complete medication review. Thank you for your help.  I confirm that guidance regarding antiplatelet and oral anticoagulation therapy has been completed and, if necessary, noted below.  Per office protocol, if patient is without any new symptoms or concerns at the time of their virtual visit, he may hold aspirin for 7 days prior to procedure. Please resume aspirin as soon as possible postprocedure, at the discretion of the surgeon.    Lenna Sciara, NP 08/14/2022, 11:53 AM [facility]

## 2022-08-14 NOTE — Telephone Encounter (Signed)
  Patient Consent for Virtual Visit         [name] has provided verbal consent on 08/14/2022 for a virtual visit (video or telephone).   CONSENT FOR VIRTUAL VISIT FOR:  [name]  By participating in this virtual visit I agree to the following:  I hereby voluntarily request, consent and authorize [facility] and its employed or contracted physicians, physician assistants, nurse practitioners or other licensed health care professionals (the Practitioner), to provide me with telemedicine health care services (the "Services") as deemed necessary by the treating Practitioner. I acknowledge and consent to receive the Services by the Practitioner via telemedicine. I understand that the telemedicine visit will involve communicating with the Practitioner through live audiovisual communication technology and the disclosure of certain medical information by electronic transmission. I acknowledge that I have been given the opportunity to request an in-person assessment or other available alternative prior to the telemedicine visit and am voluntarily participating in the telemedicine visit.  I understand that I have the right to withhold or withdraw my consent to the use of telemedicine in the course of my care at any time, without affecting my right to future care or treatment, and that the Practitioner or I may terminate the telemedicine visit at any time. I understand that I have the right to inspect all information obtained and/or recorded in the course of the telemedicine visit and may receive copies of available information for a reasonable fee.  I understand that some of the potential risks of receiving the Services via telemedicine include:  Delay or interruption in medical evaluation due to technological equipment failure or disruption; Information transmitted may not be sufficient (e.g. poor resolution of images) to allow for appropriate medical decision making by the Practitioner; and/or   In rare instances, security protocols could fail, causing a breach of personal health information.  Furthermore, I acknowledge that it is my responsibility to provide information about my medical history, conditions and care that is complete and accurate to the best of my ability. I acknowledge that Practitioner's advice, recommendations, and/or decision may be based on factors not within their control, such as incomplete or inaccurate data provided by me or distortions of diagnostic images or specimens that may result from electronic transmissions. I understand that the practice of medicine is not an exact science and that Practitioner makes no warranties or guarantees regarding treatment outcomes. I acknowledge that a copy of this consent can be made available to me via my patient portal ([facility]), or I can request a printed copy by calling the office of [facility].    I understand that my insurance will be billed for this visit.   I have read or had this consent read to me. I understand the contents of this consent, which adequately explains the benefits and risks of the Services being provided via telemedicine.  I have been provided ample opportunity to ask questions regarding this consent and the Services and have had my questions answered to my satisfaction. I give my informed consent for the services to be provided through the use of telemedicine in my medical care

## 2022-08-20 ENCOUNTER — Ambulatory Visit (INDEPENDENT_AMBULATORY_CARE_PROVIDER_SITE_OTHER): Payer: Medicare HMO | Admitting: Physician Assistant

## 2022-08-20 DIAGNOSIS — Z01810 Encounter for preprocedural cardiovascular examination: Secondary | ICD-10-CM | POA: Diagnosis not present

## 2022-08-20 NOTE — Progress Notes (Signed)
Virtual Visit via Telephone Note   Because of [name]'s co-morbid illnesses, he is at least at moderate risk for complications without adequate follow up.  This format is felt to be most appropriate for this patient at this time.  The patient did not have access to video technology/had technical difficulties with video requiring transitioning to audio format only (telephone).  All issues noted in this document were discussed and addressed.  No physical exam could be performed with this format.  Please refer to the patient's chart for his consent to telehealth for [facility].  Evaluation Performed:  Preoperative cardiovascular risk assessment _____________   Date:  08/20/2022   Patient ID:  [name], DOB Apr 12, 1956, MRN [id number] Patient Location:  Home Provider location:   Office  Primary Care Provider:  Venia Carbon, MD Primary Cardiologist:  Sherren Mocha, MD  Chief Complaint / Patient Profile   66 y.o. y/o male with a h/o CAD status post DES to LAD x3, hypertension (long history of whitecoat syndrome), HLD with prior statin intolerance who is pending right shoulder scope, possible RCR and presents today for telephonic preoperative cardiovascular risk assessment.  Past Medical History    Past Medical History:  Diagnosis Date   CAD (coronary artery disease)    post stent X 3 to LAD   Essential hypertension 10/07/2007   Qualifier: Diagnosis of  By: Wynona Luna    HEMOCCULT POSITIVE STOOL 06/17/2010   Qualifier: Diagnosis of  By: Wynona Luna    Hx of adenomatous colonic polyps 10/08/2021   Hyperglycemia 03/20/2010   Qualifier: Diagnosis of  By: Wynona Luna    Hyperlipidemia 10/08/2007   Qualifier: Diagnosis of  By: Danelle Earthly CMA, Darlene     Hypertension    Osteoarthritis of both knees 07/16/2008   Qualifier: Diagnosis of  By: Wynona Luna    Prediabetes    on metformin once a day   Statin intolerance    Unspecified disorder of  prostate 07/29/2010   Qualifier: Diagnosis of  By: Wynona Luna    Past Surgical History:  Procedure Laterality Date   COLONOSCOPY  2011   CORONARY STENT PLACEMENT  2002   EYE MUSCLE SURGERY     as child  3rd grade    Allergies  Allergies  Allergen Reactions   Atorvastatin     REACTION: myalgias   Tribenzor [Olmesartan-Amlodipine-Hctz] Other (See Comments)    Joint pain    History of Present Illness    [name] is a 66 y.o. male who presents via audio/video conferencing for a telehealth visit today.  Pt was last seen in cardiology clinic on 01/07/22 by Melina Copa, PA.  At that time [name] was doing well .  The patient is now pending procedure as outlined above. Since his last visit, he states that he is getting ready to get his shoulder cleaned out and he is unsure if he will have to do any further work.  He tells me he is at the gym 3-4 times a week and he bikes about 4 miles, uses a stair climber, and the machines.  He does not have any symptoms such as chest pain or shortness of breath.  He also is an avid Chief Financial Officer.  Because of this he has scored a 6.36 METS on the DASI.  He exceeds the minimum 4 METS requirement.  We discussed holding aspirin 7 days prior to  his procedure.  Restarting aspirin as soon as it is deemed medically safe to do so by the surgeon.   Home Medications    Prior to Admission medications   Medication Sig Start Date End Date Taking? Authorizing Provider  amLODipine (NORVASC) 10 MG tablet Take 1 tablet (10 mg total) by mouth daily. 01/07/22   Dunn, Nedra Hai, PA-C  aspirin 81 MG tablet Take 81 mg by mouth daily.    [provider]  carvedilol (COREG) 6.25 MG tablet Take 1 tablet (6.25 mg total) by mouth 2 (two) times daily with a meal. 01/07/22   Dunn, Nedra Hai, PA-C  Cholecalciferol (VITAMIN D) 50 MCG (2000 UT) tablet Take 2,000 Units by mouth daily.    [provider]  hydrochlorothiazide (HYDRODIURIL) 25 MG tablet Take  1 tablet (25 mg total) by mouth daily. 01/07/22   Dunn, Nedra Hai, PA-C  Multiple Vitamin (MULTIVITAMIN) tablet Take 1 tablet by mouth daily.    [provider]  NON FORMULARY CBD Oil    [provider]  OVER THE COUNTER MEDICATION Omega Q plus Resveratrol and Turmeric    [provider]  ramipril (ALTACE) 10 MG capsule TAKE 1 CAPSULE BY MOUTH TWICE A DAY 01/07/22   Dunn, Dayna N, PA-C  rosuvastatin (CRESTOR) 5 MG tablet TAKE 1 TABLET (5 MG TOTAL) BY MOUTH DAILY. 04/06/22   Sherren Mocha, MD    Physical Exam    Vital Signs:  [name] does not have vital signs available for review today.  Given telephonic nature of communication, physical exam is limited. AAOx3. NAD. Normal affect.  Speech and respirations are unlabored.  Accessory Clinical Findings    None  Assessment & Plan    1.  Preoperative Cardiovascular Risk Assessment:  Mr. Gulyas perioperative risk of a major cardiac event is 0.9% according to the Revised Cardiac Risk Index (RCRI).  Therefore, he is at low risk for perioperative complications.   His functional capacity is excellent at 6.36 METs according to the [place] Activity Status Index (DASI). Recommendations: According to ACC/AHA guidelines, no further cardiovascular testing needed.  The patient may proceed to surgery at acceptable risk.   Antiplatelet and/or Anticoagulation Recommendations: Aspirin can be held for 7 days prior to his surgery.  Please resume Aspirin post operatively when it is felt to be safe from a bleeding standpoint.    A copy of this note will be routed to requesting surgeon.  Time:   Today, I have spent 10 minutes with the patient with telehealth technology discussing medical history, symptoms, and management plan.     Elgie Collard, PA-C  08/20/2022, 2:53 PM

## 2022-10-02 DIAGNOSIS — M19011 Primary osteoarthritis, right shoulder: Secondary | ICD-10-CM | POA: Diagnosis not present

## 2022-10-02 DIAGNOSIS — G8918 Other acute postprocedural pain: Secondary | ICD-10-CM | POA: Diagnosis not present

## 2022-10-02 DIAGNOSIS — M75111 Incomplete rotator cuff tear or rupture of right shoulder, not specified as traumatic: Secondary | ICD-10-CM | POA: Diagnosis not present

## 2022-10-02 DIAGNOSIS — S43431A Superior glenoid labrum lesion of right shoulder, initial encounter: Secondary | ICD-10-CM | POA: Diagnosis not present

## 2022-10-02 DIAGNOSIS — M7541 Impingement syndrome of right shoulder: Secondary | ICD-10-CM | POA: Diagnosis not present

## 2022-10-02 HISTORY — PX: SHOULDER SURGERY: SHX246

## 2022-10-03 DIAGNOSIS — M19011 Primary osteoarthritis, right shoulder: Secondary | ICD-10-CM | POA: Diagnosis not present

## 2022-10-03 DIAGNOSIS — M25511 Pain in right shoulder: Secondary | ICD-10-CM | POA: Diagnosis not present

## 2022-10-03 DIAGNOSIS — E785 Hyperlipidemia, unspecified: Secondary | ICD-10-CM | POA: Diagnosis not present

## 2022-10-03 DIAGNOSIS — I519 Heart disease, unspecified: Secondary | ICD-10-CM | POA: Diagnosis not present

## 2022-10-03 DIAGNOSIS — M7541 Impingement syndrome of right shoulder: Secondary | ICD-10-CM | POA: Diagnosis not present

## 2022-10-03 DIAGNOSIS — Z4789 Encounter for other orthopedic aftercare: Secondary | ICD-10-CM | POA: Diagnosis not present

## 2022-10-03 DIAGNOSIS — S43431A Superior glenoid labrum lesion of right shoulder, initial encounter: Secondary | ICD-10-CM | POA: Diagnosis not present

## 2022-10-03 DIAGNOSIS — I890 Lymphedema, not elsewhere classified: Secondary | ICD-10-CM | POA: Diagnosis not present

## 2022-10-03 DIAGNOSIS — M75111 Incomplete rotator cuff tear or rupture of right shoulder, not specified as traumatic: Secondary | ICD-10-CM | POA: Diagnosis not present

## 2022-10-12 DIAGNOSIS — M25511 Pain in right shoulder: Secondary | ICD-10-CM | POA: Diagnosis not present

## 2023-01-01 ENCOUNTER — Other Ambulatory Visit: Payer: Self-pay | Admitting: Physician Assistant

## 2023-01-01 DIAGNOSIS — I10 Essential (primary) hypertension: Secondary | ICD-10-CM

## 2023-02-17 NOTE — Progress Notes (Unsigned)
Office Visit    Patient Name: [name] Date of Encounter: 02/18/2023  PCP:  Venia Carbon, MD   [facility]  Cardiologist:  Sherren Mocha, MD  Advanced Practice Provider:  No care team member to display Electrophysiologist:  None   HPI    [name] is a 67 y.o. male with a past medical history of CAD, HTN) long history of whitecoat syndrome), HLD with prior statin intolerance presents today for annual follow-up visit.  He had a remote abnormal stress test and underwent cardiac catheterization in 2002 status post 3 DES to LAD.  EF was 65% with mild apical HK at that time.  Otherwise, there was residual 80% superior ramus and 75% OM1.  He also had a history of whitecoat component to his hypertension with prior ambulatory BP monitor in 2017 showing average BP 131/82.  He had a history of statin intolerance on atorvastatin and rosuvastatin and had not been interested in any additional therapy such as ezetimibe or PCSK9i in the past.  He was seen in 2022 at his annual follow-up and was only able to take Crestor 1 time a week due to pain in his hands.  Hyperglycemia listed in PMH but A1c were normal.  He was seen last year and at that time he didn't not have any chest pain or dyspnea.  Exercise several times a week without any angina.  No palpitations or syncope.  Blood pressure elevated.  Was ranging [id number] systolic even at home without the element of whitecoat syndrome.  He was very hesitant to advance any of his medications.  Today, he did tell me that his blood pressure has been running high at home.  Usually anywhere from [id number] systolic.  Today, initially he was 172/98.  On repeat 160/90.  He has been on the same drug regimen for 20 years.  Last year his blood pressure was well-controlled at 132/82 (April 2023).  He tries to exercise daily and uses the stair climber. He has been trying to manage his stress better.   Reports no shortness of breath nor  dyspnea on exertion. Reports no chest pain, pressure, or tightness. No edema, orthopnea, PND. Reports no palpitations.     Past Medical History    Past Medical History:  Diagnosis Date   CAD (coronary artery disease)    post stent X 3 to LAD   Essential hypertension 10/07/2007   Qualifier: Diagnosis of  By: Wynona Luna    HEMOCCULT POSITIVE STOOL 06/17/2010   Qualifier: Diagnosis of  By: Wynona Luna    Hx of adenomatous colonic polyps 10/08/2021   Hyperglycemia 03/20/2010   Qualifier: Diagnosis of  By: Wynona Luna    Hyperlipidemia 10/08/2007   Qualifier: Diagnosis of  By: Danelle Earthly CMA, Darlene     Hypertension    Osteoarthritis of both knees 07/16/2008   Qualifier: Diagnosis of  By: Wynona Luna    Prediabetes    on metformin once a day   Statin intolerance    Unspecified disorder of prostate 07/29/2010   Qualifier: Diagnosis of  By: Wynona Luna    Past Surgical History:  Procedure Laterality Date   COLONOSCOPY  2011   CORONARY STENT PLACEMENT  2002   EYE MUSCLE SURGERY     as child  3rd grade    Allergies  Allergies  Allergen Reactions   Atorvastatin     REACTION: myalgias  Tribenzor [Olmesartan-Amlodipine-Hctz] Other (See Comments)    Joint pain     EKGs/Labs/Other Studies Reviewed:   The following studies were reviewed today:  Amb BP monitor 2017 - see scan and summary above   Diagnostic Cath 10/2001 Cardiac Catheterization   DATE OF BIRTH:  1956-07-30.   REASON FOR PRESENTATION:  Evaluate patient with an abnormal Cardiolite suggesting anterior ischemia.   PROCEDURAL NOTE:  Left heart catheterization was performed via the right femoral artery.  The artery was cannulated using anterior wall puncture.  A 6-French arterial sheath was inserted via the modified Seldinger technique. Preformed Judkins and a pigtail catheter were utilized.  The patient tolerated the procedure well.   RESULTS:   Hemodynamics:  LV  156/96, AO 155/12.   Coronaries: 1. The left main was normal. 2. The LAD had proximal 25% stenosis followed by a shelf-like 95% before the    first septal perforator.  There was mid 99% stenosis followed by a 75-80%    lesion.  There was TIMI-2 flow in the LAD following the mid 99% lesion.    There were right-to-left collaterals. 3. Circumflex:  The circumflex was a large vessel.  There were luminal    irregularities in the AV groove.  The ramus intermedius was a very large    vessel.  The superior branch had a proximal 80% stenosis.  OM-1 had a    proximal 75% stenosis. 4. Right coronary artery:  The right coronary artery was dominant with    luminal irregularities.  Again, there were right-to-left collaterals.   Left ventriculogram:  A left ventriculogram was obtained in the RAO projection.  The EF was 65% with very mild apical hypokinesis.   CONCLUSIONS:  Severe left anterior descending artery stenosis with high-grade circumflex (ramus intermedius) lesions.   PLAN:  The patient will have percutaneous revascularization of the LAD.  Will consider medical management versus revascularization electively particularly of the ramus intermedius branch. Dictated by:   Minus Breeding, M.D. [place] Attending Physician:  Fay Records DD:  10/07/01 TD:  10/07/01 Job: [id number]   Interventional Cath 09/2001 Cardiac Catheterization   PROCEDURE PERFORMED: Percutaneous transluminal coronary angioplasty and stenting of the mid and distal left anterior descending.   DIAGNOSIS: Severe single-vessel coronary artery disease.   INDICATIONS: The patient is a 67 year old, white male, without prior cardiac history, who presents with progressive substernal chest discomfort.  The patient underwent stress imaging study which showed ischemia in the anterior wall and he underwent cardiac catheterization by Dr. Percival Spanish earlier today showing severe single-vessel coronary artery disease with lesions in  the mid and distal LAD. He has well preserved LV function.  He presents now for percutaneous intervention.   TECHNIQUE: Informed consent was obtained. An existing 6 French sheath was exchanged for a 7 [place] sheath. The patient was given heparin and Integrilin on a weight-adjusted basis to maintain ACT of greater than 250 seconds. In addition he was given Plavix 300 mg orally. A 7 French Q 3.5 guide catheter was used to engage the left coronary artery and selective guide shots using manual injections of contrast. This showed a high-grade plaque of 70% in the mid LAD and immediately after the first diagonal branch there was a further subtotal narrowing of 99% in the mid section following by a high-grade tubular narrowing of 70% in the distal section.  A 0.14 inch extra-support wire was then advanced to the distal LAD and a 3.0 x 10 mm Cutting Balloon introduced. This  was used to dilate the distal lesion at 6 atmospheres at 30 seconds x2 inflations, the mid lesion at 6 atmospheres times 30 seconds for 2 inflations as well as the proximal mid lesion at 10 atmospheres for 30 seconds x2 lesions.  Repeat angiography showed only mild improvement in the vessel lumen in the distal lesion. There was mild improvement in lumen but residual haziness in the mid lesion and only modest improvement of lumen in the proximal lesion. A 3.0 x 12 mm Express II stent was then introduced to the mid lesion deployed at 12 atmospheres for 60 seconds. A 3.5 x 16 mm Express II was placed in the proximal lesion and deployed at 14 atmospheres for 60 seconds. Repeat angiography showed significant improvement in these vessel lumen with no residual stenosis.  However, there was now evidence of a dissection in the distal lesion which was previously unappreciated. A 3.0 x 12 mm Express II was then positioned in the distal LAD and deployed at 12 atmospheres for 30 seconds. Repeat angiography was now performed after the  administration of intracoronary nitroglycerin showing excellent result with no residual stenosis in the area of stenting. There was moderate residual disease of 30% in the mid LAD and improvement from TIMI grade 2 to TIMI grade 3 flow. Final angiography was performed in various projections, confirmed these findings.  The guide catheter was then removed and the sheath secured in position. The patient tolerated the procedure well.   FINAL RESULTS: 1. Successful percutaneous transluminal coronary angioplasty and stenting of    the mid left anterior descending immediately after the first diagonal    branch with reduction of 70% eccentric narrowing to 0% with placement of    a 3.5 x 16 mm Express II. 2. Successful percutaneous transluminal coronary angioplasty and stenting of    of the mid left anterior descending with reduction of subtotal 99%    narrowing to 0% with placement of a 3.0 x 12 mm Express II. 3. Successful percutaneous transluminal coronary angioplasty and stenting of    the distal left anterior descending with reduction of 70% narrowing to 0%    with placement of a 3.0 x 12 mm Express II stent. Dictated by:   Christy Sartorius, M.D. Attending Physician:  Fay Records DD:  10/07/01 TD:  10/08/01 Job: [id number]  EKG:  EKG is  ordered today.  The ekg ordered today demonstrates normal sinus rhythm, 64 bpm  Recent Labs: No results found for requested labs within last 365 days.  Recent Lipid Panel    Component Value Date/Time   CHOL 162 01/07/2022 0915   TRIG 157 (H) 01/07/2022 0915   TRIG 79 01/03/2007 0737   HDL 34 (L) 01/07/2022 0915   CHOLHDL 4.8 01/07/2022 0915   CHOLHDL 4 03/21/2021 1020   VLDL 32.2 03/21/2021 1020   LDLCALC 100 (H) 01/07/2022 0915   LDLDIRECT 96.0 01/06/2019 1035    Home Medications   Current Meds  Medication Sig   amLODipine (NORVASC) 10 MG tablet Take 1 tablet (10 mg total) by mouth daily.   aspirin 81 MG tablet Take 81 mg by mouth daily.    carvedilol (COREG) 6.25 MG tablet Take 1 tablet (6.25 mg total) by mouth 2 (two) times daily with a meal.   Cholecalciferol (VITAMIN D) 50 MCG (2000 UT) tablet Take 2,000 Units by mouth daily.   Multiple Vitamin (MULTIVITAMIN) tablet Take 1 tablet by mouth daily.   NON FORMULARY CBD Oil   olmesartan-hydrochlorothiazide (BENICAR  HCT) 40-25 MG tablet Take 1 tablet by mouth daily.   OVER THE COUNTER MEDICATION Omega Q plus Resveratrol and Turmeric   rosuvastatin (CRESTOR) 5 MG tablet TAKE 1 TABLET (5 MG TOTAL) BY MOUTH DAILY.   [DISCONTINUED] hydrochlorothiazide (HYDRODIURIL) 25 MG tablet Take 1 tablet (25 mg total) by mouth daily.   [DISCONTINUED] ramipril (ALTACE) 10 MG capsule TAKE 1 CAPSULE BY MOUTH TWICE A DAY     Review of Systems      All other systems reviewed and are otherwise negative except as noted above.  Physical Exam    VS:  BP (!) 160/90   Pulse 72   Ht 5' 4.5" (1.638 m)   Wt 181 lb 6.4 oz (82.3 kg)   SpO2 97%   BMI 30.66 kg/m  , BMI Body mass index is 30.66 kg/m.  Wt Readings from Last 3 Encounters:  02/18/23 181 lb 6.4 oz (82.3 kg)  04/22/22 177 lb (80.3 kg)  01/07/22 177 lb 3.2 oz (80.4 kg)     GEN: Well nourished, well developed, in no acute distress. HEENT: normal. Neck: Supple, no JVD, carotid bruits, or masses. Cardiac: RRR, no murmurs, rubs, or gallops. No clubbing, cyanosis, edema.  Radials/PT 2+ and equal bilaterally.  Respiratory:  Respirations regular and unlabored, clear to auscultation bilaterally. GI: Soft, nontender, nondistended. MS: No deformity or atrophy. Skin: Warm and dry, no rash. Neuro:  Strength and sensation are intact. Psych: Normal affect.  Assessment & Plan    CAD -No recent chest pain and able to exercise daily without issues -Continue amlodipine 10 mg daily, aspirin 81 mg daily, Coreg 6.25 mg twice daily, switch HCTZ and ramipril to Benicar HCT  Essential hypertension -Discontinue HCTZ and ramipril 10 mg daily and start  Benicar HCT 40 mg/25 mg -Please keep track of your blood pressure an hour after morning medications -We will set up a 2-week nursing blood pressure check  Hyperlipidemia -Last LDL 100 and HDL 34, triglycerides 157 -Continue Crestor 5 mg daily -He is due for an updated lipid panel and LFTs, we will order this today -If his LDL remains elevated we would favor increasing his Crestor for better control.  Goal less than 70  Elevated ALT -repeat LFTs  HYPERTENSION CONTROL Vitals:   02/18/23 0757 02/18/23 1001  BP: (!) 172/98 (!) 160/90    The patient's blood pressure is elevated above target today.  In order to address the patient's elevated BP: A new medication was prescribed today.         Disposition: Follow up 1 year with Sherren Mocha, MD or APP.  Signed, Elgie Collard, PA-C 02/18/2023, 10:01 AM [place]

## 2023-02-18 ENCOUNTER — Ambulatory Visit: Payer: Medicare HMO | Attending: Physician Assistant | Admitting: Physician Assistant

## 2023-02-18 VITALS — BP 160/90 | HR 72 | Ht 64.5 in | Wt 181.4 lb

## 2023-02-18 DIAGNOSIS — R7401 Elevation of levels of liver transaminase levels: Secondary | ICD-10-CM | POA: Diagnosis not present

## 2023-02-18 DIAGNOSIS — I10 Essential (primary) hypertension: Secondary | ICD-10-CM | POA: Diagnosis not present

## 2023-02-18 DIAGNOSIS — I2510 Atherosclerotic heart disease of native coronary artery without angina pectoris: Secondary | ICD-10-CM | POA: Diagnosis not present

## 2023-02-18 DIAGNOSIS — E785 Hyperlipidemia, unspecified: Secondary | ICD-10-CM

## 2023-02-18 LAB — COMPREHENSIVE METABOLIC PANEL
ALT: 57 IU/L — ABNORMAL HIGH (ref 0–44)
AST: 29 IU/L (ref 0–40)
Albumin/Globulin Ratio: 2.1 (ref 1.2–2.2)
Albumin: 4.8 g/dL (ref 3.9–4.9)
Alkaline Phosphatase: 71 IU/L (ref 44–121)
BUN/Creatinine Ratio: 21 (ref 10–24)
BUN: 19 mg/dL (ref 8–27)
Bilirubin Total: 0.6 mg/dL (ref 0.0–1.2)
CO2: 23 mmol/L (ref 20–29)
Calcium: 9.6 mg/dL (ref 8.6–10.2)
Chloride: 104 mmol/L (ref 96–106)
Creatinine, Ser: 0.92 mg/dL (ref 0.76–1.27)
Globulin, Total: 2.3 g/dL (ref 1.5–4.5)
Glucose: 137 mg/dL — ABNORMAL HIGH (ref 70–99)
Potassium: 4 mmol/L (ref 3.5–5.2)
Sodium: 141 mmol/L (ref 134–144)
Total Protein: 7.1 g/dL (ref 6.0–8.5)
eGFR: 91 mL/min/{1.73_m2} (ref >59)

## 2023-02-18 LAB — LIPID PANEL
Chol/HDL Ratio: 4.6 ratio (ref 0.0–5.0)
Cholesterol, Total: 181 mg/dL (ref 100–199)
HDL: 39 mg/dL — ABNORMAL LOW (ref >39)
LDL Chol Calc (NIH): 99 mg/dL (ref 0–99)
Triglycerides: 253 mg/dL — ABNORMAL HIGH (ref 0–149)
VLDL Cholesterol Cal: 43 mg/dL — ABNORMAL HIGH (ref 5–40)

## 2023-02-18 LAB — HEPATIC FUNCTION PANEL: Bilirubin, Direct: 0.16 mg/dL (ref 0.00–0.40)

## 2023-02-18 MED ORDER — OLMESARTAN MEDOXOMIL-HCTZ 40-25 MG PO TABS: 1.0000 | ORAL_TABLET | Freq: Every day | ORAL | 3 refills | Status: DC

## 2023-02-18 NOTE — Patient Instructions (Addendum)
Medication Instructions:  1.Stop ramipril 2.Stop hctz 3.Start benicar-hctz 40-25 mg daily *If you need a refill on your cardiac medications before your next appointment, please call your pharmacy*   Lab Work: CMET, LFT'S and Lipids-today If you have labs (blood work) drawn today and your tests are completely normal, you will receive your results only by: [name] (if you have MyChart) OR A paper copy in the mail If you have any lab test that is abnormal or we need to change your treatment, we will call you to review the results.   Follow-Up: At [facility], you and your health needs are our priority.  As part of our continuing mission to provide you with exceptional heart care, we have created designated Provider Care Teams.  These Care Teams include your primary Cardiologist (physician) and Advanced Practice Providers (APPs -  Physician Assistants and Nurse Practitioners) who all work together to provide you with the care you need, when you need it.  We recommend signing up for the patient portal called "MyChart".  Sign up information is provided on this After Visit Summary.  MyChart is used to connect with patients for Virtual Visits (Telemedicine).  Patients are able to view lab/test results, encounter notes, upcoming appointments, etc.  Non-urgent messages can be sent to your provider as well.   To learn more about what you can do with MyChart, go to [email or web address].    Your next appointment:   1 year(s)  Provider:   Sherren Mocha, MD    Schedule a 2 week nurse visit for a blood pressure check.  Other Instructions Check your blood pressure daily, one hour after taking your morning medications for 2 weeks, keep a log and bring it with you to your nurse visit.  Low-Sodium Eating Plan Sodium, which is an element that makes up salt, helps you maintain a healthy balance of fluids in your body. Too much sodium can increase your blood pressure and cause fluid  and waste to be held in your body. Your health care provider or dietitian may recommend following this plan if you have high blood pressure (hypertension), kidney disease, liver disease, or heart failure. Eating less sodium can help lower your blood pressure, reduce swelling, and protect your heart, liver, and kidneys. What are tips for following this plan? Reading food labels The Nutrition Facts label lists the amount of sodium in one serving of the food. If you eat more than one serving, you must multiply the listed amount of sodium by the number of servings. Choose foods with less than 140 mg of sodium per serving. Avoid foods with 300 mg of sodium or more per serving. Shopping  Look for lower-sodium products, often labeled as "low-sodium" or "no salt added." Always check the sodium content, even if foods are labeled as "unsalted" or "no salt added." Buy fresh foods. Avoid canned foods and pre-made or frozen meals. Avoid canned, cured, or processed meats. Buy breads that have less than 80 mg of sodium per slice. Cooking  Eat more home-cooked food and less restaurant, buffet, and fast food. Avoid adding salt when cooking. Use salt-free seasonings or herbs instead of table salt or sea salt. Check with your health care provider or pharmacist before using salt substitutes. Cook with plant-based oils, such as canola, sunflower, or olive oil. Meal planning When eating at a restaurant, ask that your food be prepared with less salt or no salt, if possible. Avoid dishes labeled as brined, pickled, cured, smoked, or made with  soy sauce, miso, or teriyaki sauce. Avoid foods that contain MSG (monosodium glutamate). MSG is sometimes added to [place] food, bouillon, and some canned foods. Make meals that can be grilled, baked, poached, roasted, or steamed. These are generally made with less sodium. General information Most people on this plan should limit their sodium intake to 1,500-2,000 mg  (milligrams) of sodium each day. What foods should I eat? Fruits Fresh, frozen, or canned fruit. Fruit juice. Vegetables Fresh or frozen vegetables. "No salt added" canned vegetables. "No salt added" tomato sauce and paste. Low-sodium or reduced-sodium tomato and vegetable juice. Grains Low-sodium cereals, including oats, puffed wheat and rice, and shredded wheat. Low-sodium crackers. Unsalted rice. Unsalted pasta. Low-sodium bread. Whole-grain breads and whole-grain pasta. Meats and other proteins Fresh or frozen (no salt added) meat, poultry, seafood, and fish. Low-sodium canned tuna and salmon. Unsalted nuts. Dried peas, beans, and lentils without added salt. Unsalted canned beans. Eggs. Unsalted nut butters. Dairy Milk. Soy milk. Cheese that is naturally low in sodium, such as ricotta cheese, fresh mozzarella, or Swiss cheese. Low-sodium or reduced-sodium cheese. Cream cheese. Yogurt. Seasonings and condiments Fresh and dried herbs and spices. Salt-free seasonings. Low-sodium mustard and ketchup. Sodium-free salad dressing. Sodium-free light mayonnaise. Fresh or refrigerated horseradish. Lemon juice. Vinegar. Other foods Homemade, reduced-sodium, or low-sodium soups. Unsalted popcorn and pretzels. Low-salt or salt-free chips. The items listed above may not be a complete list of foods and beverages you can eat. Contact a dietitian for more information. What foods should I avoid? Vegetables Sauerkraut, pickled vegetables, and relishes. Olives. [place] fries. Onion rings. Regular canned vegetables (not low-sodium or reduced-sodium). Regular canned tomato sauce and paste (not low-sodium or reduced-sodium). Regular tomato and vegetable juice (not low-sodium or reduced-sodium). Frozen vegetables in sauces. Grains Instant hot cereals. Bread stuffing, pancake, and biscuit mixes. Croutons. Seasoned rice or pasta mixes. Noodle soup cups. Boxed or frozen macaroni and cheese. Regular salted crackers.  Self-rising flour. Meats and other proteins Meat or fish that is salted, canned, smoked, spiced, or pickled. Precooked or cured meat, such as sausages or meat loaves. [name]. Ham. Pepperoni. Hot dogs. Corned beef. Chipped beef. Salt pork. Jerky. Pickled herring. Anchovies and sardines. Regular canned tuna. Salted nuts. Dairy Processed cheese and cheese spreads. Hard cheeses. Cheese curds. Blue cheese. Feta cheese. String cheese. Regular cottage cheese. Buttermilk. Canned milk. Fats and oils Salted butter. Regular margarine. Ghee. Bacon fat. Seasonings and condiments Onion salt, garlic salt, seasoned salt, table salt, and sea salt. Canned and packaged gravies. Worcestershire sauce. Tartar sauce. Barbecue sauce. Teriyaki sauce. Soy sauce, including reduced-sodium. Steak sauce. Fish sauce. Oyster sauce. Cocktail sauce. Horseradish that you find on the shelf. Regular ketchup and mustard. Meat flavorings and tenderizers. Bouillon cubes. Hot sauce. Pre-made or packaged marinades. Pre-made or packaged taco seasonings. Relishes. Regular salad dressings. Salsa. Other foods Salted popcorn and pretzels. Corn chips and puffs. Potato and tortilla chips. Canned or dried soups. Pizza. Frozen entrees and pot pies. The items listed above may not be a complete list of foods and beverages you should avoid. Contact a dietitian for more information. Summary Eating less sodium can help lower your blood pressure, reduce swelling, and protect your heart, liver, and kidneys. Most people on this plan should limit their sodium intake to 1,500-2,000 mg (milligrams) of sodium each day. Canned, boxed, and frozen foods are high in sodium. Restaurant foods, fast foods, and pizza are also very high in sodium. You also get sodium by adding salt to food. Try to cook  at home, eat more fresh fruits and vegetables, and eat less fast food and canned, processed, or prepared foods. This information is not intended to replace advice given to  you by your health care provider. Make sure you discuss any questions you have with your health care provider. Document Revised: 01/19/2020 Document Reviewed: 11/15/2019 Elsevier Patient Education  [place].

## 2023-02-24 ENCOUNTER — Other Ambulatory Visit: Payer: Self-pay | Admitting: Physician Assistant

## 2023-02-24 DIAGNOSIS — I10 Essential (primary) hypertension: Secondary | ICD-10-CM

## 2023-03-04 ENCOUNTER — Ambulatory Visit: Payer: Medicare HMO | Attending: Cardiovascular Disease

## 2023-03-04 ENCOUNTER — Telehealth: Payer: Self-pay

## 2023-03-04 DIAGNOSIS — I10 Essential (primary) hypertension: Secondary | ICD-10-CM

## 2023-03-04 DIAGNOSIS — I2510 Atherosclerotic heart disease of native coronary artery without angina pectoris: Secondary | ICD-10-CM | POA: Diagnosis not present

## 2023-03-04 NOTE — Patient Instructions (Signed)
Medication Instructions:  Continue current medications *If you need a refill on your cardiac medications before your next appointment, please call your pharmacy*   Lab Work: None If you have labs (blood work) drawn today and your tests are completely normal, you will receive your results only by: [place] (if you have MyChart) OR A paper copy in the mail If you have any lab test that is abnormal or we need to change your treatment, we will call you to review the results.   Testing/Procedures: None   Follow-Up: At [facility], you and your health needs are our priority.  As part of our continuing mission to provide you with exceptional heart care, we have created designated Provider Care Teams.  These Care Teams include your primary Cardiologist (physician) and Advanced Practice Providers (APPs -  Physician Assistants and Nurse Practitioners) who all work together to provide you with the care you need, when you need it.  We recommend signing up for the patient portal called "MyChart".  Sign up information is provided on this After Visit Summary.  MyChart is used to connect with patients for Virtual Visits (Telemedicine).  Patients are able to view lab/test results, encounter notes, upcoming appointments, etc.  Non-urgent messages can be sent to your provider as well.   To learn more about what you can do with MyChart, go to [email or web address].    Your next appointment:    Keep scheduled follow up  Provider:   Nicholes Rough PA Other Instructions Continue on current medications. Please continue to track your blood pressure. Report continuous systolic pressure above [id number].

## 2023-03-04 NOTE — Progress Notes (Signed)
   Nurse Visit   Date of Encounter: 03/04/2023 ID: [name], DOB 11/21/1956, MRN [id number]  PCP:  Venia Carbon, MD   [place] Prov[facility] Cardiologist:  Sherren Mocha, MD      Visit Details   VS:  BP (!) 170/98 (BP Location: Left Arm, Patient Position: Sitting, Cuff Size: Normal)   Pulse 75   Ht '5\' 5"'$  (1.651 m)   Wt 181 lb (82.1 kg)   BMI 30.12 kg/m  , BMI Body mass index is 30.12 kg/m.  Wt Readings from Last 3 Encounters:  03/04/23 181 lb (82.1 kg)  02/18/23 181 lb 6.4 oz (82.3 kg)  04/22/22 177 lb (80.3 kg)     Reason for visit: Blood Pressure Check Performed today: Vitals, EKG, Provider consulted: Dr. Myles Gip, and Education Changes (medications, testing, etc.) : None Length of Visit: 30 minutes  Patient blood pressure was 170/98 today. He brought his home blood pressure cuff and his tracking of his readings. His home readings ranged from 140-150/70-90. His home cuff works appropriately. His reading on home cuff was in range with manual check today. He does not want to start new medications. Consulted with Tessa and Dr. Myles Gip and patient will continue on current medications. He will continue to track blood pressure and report continuous systolic pressure [id number] or above. He verbalized understanding.    Medications Adjustments/Labs and Tests Ordered: No orders of the defined types were placed in this encounter.  No orders of the defined types were placed in this encounter.    Signed, Deshia Vanderhoof Josephina Shih, LPN  075-GRM 075-GRM AM

## 2023-03-04 NOTE — Telephone Encounter (Signed)
During  nurse visit patient stated the we have been trying to call him and he think we were trying to tell him about his labs. Patient is aware of lab results  Your recommendations below:  Mr. Maisonet,   Your triglycerides are really high. Are you open to increasing your crestor or starting a fish oil supplement?   Elgie Collard, PA-C  He stated that he does not do well with statin's and he can not tolerate more than the '5mg'$  and he only take that once a week on Sundays because it causes him to have cramps. He also tried lipitor in the past.   Nurse Clarise Cruz discussed [facility] with him. Are you able to place referral for Lipid Clinic?

## 2023-03-10 NOTE — Telephone Encounter (Signed)
Referral placed.

## 2023-03-10 NOTE — Addendum Note (Signed)
Addended by: Moishe Spice on: 03/10/2023 08:16 AM   Modules accepted: Orders

## 2023-04-13 ENCOUNTER — Ambulatory Visit: Payer: Medicare HMO | Attending: Cardiology | Admitting: Pharmacist

## 2023-04-13 DIAGNOSIS — M791 Myalgia, unspecified site: Secondary | ICD-10-CM | POA: Diagnosis not present

## 2023-04-13 DIAGNOSIS — I2510 Atherosclerotic heart disease of native coronary artery without angina pectoris: Secondary | ICD-10-CM

## 2023-04-13 DIAGNOSIS — T466X5A Adverse effect of antihyperlipidemic and antiarteriosclerotic drugs, initial encounter: Secondary | ICD-10-CM | POA: Diagnosis not present

## 2023-04-13 DIAGNOSIS — E7849 Other hyperlipidemia: Secondary | ICD-10-CM | POA: Diagnosis not present

## 2023-04-13 NOTE — Progress Notes (Unsigned)
Patient ID: SIMS LADAY                 DOB: 18-Aug-1956                    MRN: [id number]     HPI: [name] is a 67 y.o. male patient referred to lipid clinic by Jari Favre. PMH is significant for HTN, CAD, HLD and statin intolerance.  Patient presents today in good spirits. Intolerant to statins. Takes rosuvastatin  every Sunday, can not tolerate any higher dosages.  History of three stents to LAD about 20 years ago.  Follows a heart healthy diet. Does not eat processed foods, avoids sugar. Does not drink alcohol so does not know why triglycerides have increased. Goes to the gym for an hour or more most days of the week. No tobacco use. Is not diabetic.   Current Medications: N/A Intolerances: Statins Risk Factors:  CAD LDL goal: <70   Labs: TC 181, Trigs 253, HDL 39, LDL 99 (on rosuvastatin  once weekly)  Past Medical History:  Diagnosis Date   CAD (coronary artery disease)    post stent X 3 to LAD   Essential hypertension 10/07/2007   Qualifier: Diagnosis of  By: Nena Jordan    HEMOCCULT POSITIVE STOOL 06/17/2010   Qualifier: Diagnosis of  By: Nena Jordan    Hx of adenomatous colonic polyps 10/08/2021   Hyperglycemia 03/20/2010   Qualifier: Diagnosis of  By: Nena Jordan    Hyperlipidemia 10/08/2007   Qualifier: Diagnosis of  By: Terrilee Croak CMA, Darlene     Hypertension    Osteoarthritis of both knees 07/16/2008   Qualifier: Diagnosis of  By: Nena Jordan    Prediabetes    on metformin once a day   Statin intolerance    Unspecified disorder of prostate 07/29/2010   Qualifier: Diagnosis of  By: Nena Jordan     Current Outpatient Medications on File Prior to Visit  Medication Sig Dispense Refill   amLODipine (NORVASC) 10 MG tablet Take 1 tablet (10 mg total) by mouth daily. 90 tablet 3   aspirin 81 MG tablet Take 81 mg by mouth daily.     carvedilol (COREG) 6.25 MG tablet TAKE 1 TABLET BY MOUTH 2 TIMES DAILY WITH A MEAL. 180  tablet 3   Cholecalciferol (VITAMIN D) 50 MCG (2000 UT) tablet Take 2,000 Units by mouth daily.     metFORMIN (GLUCOPHAGE-XR) 500 MG 24 hr tablet Take 500 mg by mouth daily with breakfast.     Multiple Vitamin (MULTIVITAMIN) tablet Take 1 tablet by mouth daily.     NON FORMULARY CBD Oil     olmesartan-hydrochlorothiazide (BENICAR HCT) 40-25 MG tablet Take 1 tablet by mouth daily. 90 tablet 3   ondansetron (ZOFRAN) 4 MG tablet TAKE 1 TABLET BY MOUTH EVERY 6 TO 8 HOURS AS NEEDED FOR POST OP NAUSEA     OVER THE COUNTER MEDICATION Omega Q plus Resveratrol and Turmeric     oxyCODONE-acetaminophen (PERCOCET/ROXICET) 5-325 MG tablet Take 1 tablet by mouth every 4 (four) hours as needed.     rosuvastatin (CRESTOR) 5 MG tablet TAKE 1 TABLET (5 MG TOTAL) BY MOUTH DAILY. 90 tablet 2   No current facility-administered medications on file prior to visit.    Allergies  Allergen Reactions   Atorvastatin     REACTION: myalgias   Tribenzor [Olmesartan-Amlodipine-Hctz] Other (See Comments)    Joint  pain    Assessment/Plan:  1. Hyperlipidemia - Patient LDL 99 which is above goal of <70 and triglycerides 253 which is above goal of <150. Triglycerides increased by 100 points in past year without obvious cause. Patient does not drink, eat poorly, and exercises. Consider lab error?  Patient is now willing to try PCSK9i. Using [employer], educated patient on mechanism of action, storage, site selection, administration and possible side effects. Patient was able to demonstrate in room. Will complete PA and contact patient when approved. Recheck lipid panel in 2-3 months.   Patient may have triglycerides rechecked at PCP office later this month. If not, will recheck when in 2-3 months.  Start Repatha  q 2 weeks Recheck lipid panel   Laural Golden, PharmD, BCACP, CDCES, CPP [address], [address] [place], [place], [address] Phone: [phone], Fax: [phone]

## 2023-04-13 NOTE — Patient Instructions (Addendum)
It was nice to meet you today  We would like your LDL (bad cholesterol) to be less than 70 and your triglycerides less than 150   The medication we would like to start is called Repatha which you would inject once every 2 weeks  I will complete the prior authorization for you  Once you start the medication, we will recheck your fasting lab work in 2-3 months  Please call or message with any questions  Laural Golden, PharmD, BCACP, CDCES, CPP [address], [address] [place], [place], [address] Phone: [phone], Fax: [phone]

## 2023-04-14 ENCOUNTER — Telehealth: Payer: Self-pay

## 2023-04-14 ENCOUNTER — Telehealth: Payer: Self-pay | Admitting: Pharmacist

## 2023-04-14 ENCOUNTER — Other Ambulatory Visit (HOSPITAL_COMMUNITY): Payer: Self-pay

## 2023-04-14 DIAGNOSIS — I2510 Atherosclerotic heart disease of native coronary artery without angina pectoris: Secondary | ICD-10-CM

## 2023-04-14 DIAGNOSIS — E7849 Other hyperlipidemia: Secondary | ICD-10-CM

## 2023-04-14 MED ORDER — REPATHA SURECLICK 140 MG/ML ~~LOC~~ SOAJ
1.0000 mL | SUBCUTANEOUS | 11 refills | Status: DC
Start: 2023-04-14 — End: 2024-07-27

## 2023-04-14 NOTE — Telephone Encounter (Signed)
Pharmacy Patient Advocate Encounter   Received notification from [facility] that prior authorization for REPATHA is needed.    PA submitted on 04/14/23 Key [id number] Status is pending  Haze Rushing, CPhT Pharmacy Patient Advocate Specialist Direct Number: [phone] Fax: [phone]

## 2023-04-14 NOTE — Telephone Encounter (Signed)
Contacted patient and lmom

## 2023-04-14 NOTE — Telephone Encounter (Signed)
Pharmacy Patient Advocate Encounter  Prior Authorization for REPATHA has been approved.   [id number] Effective dates: 12/28/22 through 12/28/23  Haze Rushing, CPhT Pharmacy Patient Advocate Specialist Direct Number: [phone] Fax: [phone]

## 2023-04-14 NOTE — Addendum Note (Signed)
Addended by: Cheree Ditto on: 04/14/2023 04:37 PM   Modules accepted: Orders

## 2023-04-20 NOTE — Telephone Encounter (Signed)
PA

## 2023-04-27 ENCOUNTER — Ambulatory Visit: Payer: Medicare HMO | Admitting: Internal Medicine

## 2023-05-03 ENCOUNTER — Encounter: Payer: Self-pay | Admitting: Internal Medicine

## 2023-05-03 ENCOUNTER — Ambulatory Visit (INDEPENDENT_AMBULATORY_CARE_PROVIDER_SITE_OTHER): Payer: Medicare HMO | Admitting: Internal Medicine

## 2023-05-03 VITALS — BP 146/84 | HR 66 | Temp 98.2°F | Ht 64.5 in | Wt 180.0 lb

## 2023-05-03 DIAGNOSIS — I10 Essential (primary) hypertension: Secondary | ICD-10-CM | POA: Diagnosis not present

## 2023-05-03 DIAGNOSIS — Z125 Encounter for screening for malignant neoplasm of prostate: Secondary | ICD-10-CM

## 2023-05-03 DIAGNOSIS — M159 Polyosteoarthritis, unspecified: Secondary | ICD-10-CM | POA: Diagnosis not present

## 2023-05-03 DIAGNOSIS — R7303 Prediabetes: Secondary | ICD-10-CM

## 2023-05-03 DIAGNOSIS — I2510 Atherosclerotic heart disease of native coronary artery without angina pectoris: Secondary | ICD-10-CM | POA: Diagnosis not present

## 2023-05-03 DIAGNOSIS — Z0000 Encounter for general adult medical examination without abnormal findings: Secondary | ICD-10-CM | POA: Diagnosis not present

## 2023-05-03 DIAGNOSIS — M150 Primary generalized (osteo)arthritis: Secondary | ICD-10-CM

## 2023-05-03 LAB — LIPID PANEL
Cholesterol: 92 mg/dL (ref 0–200)
HDL: 40.3 mg/dL (ref >39.00)
NonHDL: 52.12
Total CHOL/HDL Ratio: 2
Triglycerides: 222 mg/dL — ABNORMAL HIGH (ref 0.0–149.0)
VLDL: 44.4 mg/dL — ABNORMAL HIGH (ref 0.0–40.0)

## 2023-05-03 LAB — CBC
HCT: 46.2 % (ref 39.0–52.0)
Hemoglobin: 16.2 g/dL (ref 13.0–17.0)
MCHC: 35.1 g/dL (ref 30.0–36.0)
MCV: 86.4 fl (ref 78.0–100.0)
Platelets: 226 10*3/uL (ref 150.0–400.0)
RBC: 5.35 Mil/uL (ref 4.22–5.81)
RDW: 13.8 % (ref 11.5–15.5)
WBC: 6.7 10*3/uL (ref 4.0–10.5)

## 2023-05-03 LAB — COMPREHENSIVE METABOLIC PANEL
ALT: 76 U/L — ABNORMAL HIGH (ref 0–53)
AST: 30 U/L (ref 0–37)
Albumin: 4.7 g/dL (ref 3.5–5.2)
Alkaline Phosphatase: 66 U/L (ref 39–117)
BUN: 20 mg/dL (ref 6–23)
CO2: 29 mEq/L (ref 19–32)
Calcium: 9.9 mg/dL (ref 8.4–10.5)
Chloride: 100 mEq/L (ref 96–112)
Creatinine, Ser: 0.96 mg/dL (ref 0.40–1.50)
GFR: 81.34 mL/min (ref >60.00)
Glucose, Bld: 126 mg/dL — ABNORMAL HIGH (ref 70–99)
Potassium: 4.1 mEq/L (ref 3.5–5.1)
Sodium: 140 mEq/L (ref 135–145)
Total Bilirubin: 0.7 mg/dL (ref 0.2–1.2)
Total Protein: 7.2 g/dL (ref 6.0–8.3)

## 2023-05-03 LAB — LDL CHOLESTEROL, DIRECT: Direct LDL: 34 mg/dL

## 2023-05-03 LAB — PSA, MEDICARE: PSA: 0.62 ng/ml (ref 0.10–4.00)

## 2023-05-03 LAB — HEMOGLOBIN A1C: Hgb A1c MFr Bld: 5.6 % (ref 4.6–6.5)

## 2023-05-03 NOTE — Progress Notes (Signed)
Vision Screening   Right eye Left eye Both eyes  Without correction 20/40 20/40 20/30  With correction     Hearing Screening - Comments:: Passed whisper test   

## 2023-05-03 NOTE — Assessment & Plan Note (Signed)
Quiet on BP regimen and now repatha ASA 81mg  also

## 2023-05-03 NOTE — Progress Notes (Signed)
Subjective:    Patient ID: [name], male    DOB: [birth year], 67 y.o.   MRN: [id number]  HPI Here for Medicare wellness visit and follow up of chronic health conditions Reviewed advanced directives Reviewed other doctors---Dr Supple---ortho, Dr Excell Seltzer [facility]) --cardiology, Dr Brennan Bailey Only surgery was on right shoulder--no hospitalizations Works a little--but mostly retired Does go to the gym regularly---has social network there Vision slowly worsening---bifocals. Needs to get eye doctor Hearing is okay----left ear not as good as right No alcohol or tobacco No falls No depression or anhedonia Independent with instrumental ADLs No memory issues  Doing well Did have right shoulder surgery in October--arthroscopy Now is much better Still uses CBD at night to help sleep and for joints Does get stiffness in his knuckles  Checks BP regularly at home Always high here 152/88, 146/84 this morning---typically in the 140's Did have ambulatory BP monitor via cardiology---that was pretty good Now on olmesartan HCTZ  Now on repatha injections--recent start  Doesn't check sugars  Current Outpatient Medications on File Prior to Visit  Medication Sig Dispense Refill   amLODipine (NORVASC) 10 MG tablet Take 1 tablet (10 mg total) by mouth daily. 90 tablet 3   aspirin 81 MG tablet Take 81 mg by mouth daily.     carvedilol (COREG) 6.25 MG tablet TAKE 1 TABLET BY MOUTH 2 TIMES DAILY WITH A MEAL. 180 tablet 3   Cholecalciferol (VITAMIN D) 50 MCG (2000 UT) tablet Take 2,000 Units by mouth daily.     Evolocumab (REPATHA SURECLICK) 140 MG/ML SOAJ Inject 140 mg into the skin every 14 (fourteen) days. 2 mL 11   Multiple Vitamin (MULTIVITAMIN) tablet Take 1 tablet by mouth daily.     NON FORMULARY CBD Oil     olmesartan-hydrochlorothiazide (BENICAR HCT) 40-25 MG tablet Take 1 tablet by mouth daily. 90 tablet 3   OVER THE COUNTER MEDICATION Omega Q plus Resveratrol and Turmeric     No  current facility-administered medications on file prior to visit.    Allergies  Allergen Reactions   Atorvastatin     REACTION: myalgias   Tribenzor [Olmesartan-Amlodipine-Hctz] Other (See Comments)    Joint pain    Past Medical History:  Diagnosis Date   CAD (coronary artery disease)    post stent X 3 to LAD   Essential hypertension 10/07/2007   Qualifier: Diagnosis of  By: Nena Jordan    HEMOCCULT POSITIVE STOOL 06/17/2010   Qualifier: Diagnosis of  By: Nena Jordan    Hx of adenomatous colonic polyps 10/08/2021   Hyperglycemia 03/20/2010   Qualifier: Diagnosis of  By: Nena Jordan    Hyperlipidemia 10/08/2007   Qualifier: Diagnosis of  By: Terrilee Croak CMA, Darlene     Hypertension    Osteoarthritis of both knees 07/16/2008   Qualifier: Diagnosis of  By: Nena Jordan    Prediabetes    on metformin once a day   Statin intolerance    Unspecified disorder of prostate 07/29/2010   Qualifier: Diagnosis of  By: Nena Jordan     Past Surgical History:  Procedure Laterality Date   COLONOSCOPY  2011   CORONARY STENT PLACEMENT  2002   EYE MUSCLE SURGERY     as child  3rd grade   SHOULDER SURGERY Right 10/02/2022    Family History  Problem Relation Age of Onset   Coronary artery disease Mother    Heart attack Mother  early 42's   Diabetes Mother    Hypertension Mother    Cancer Father        lung    Heart attack Brother    Heart attack Brother    Colon cancer Neg Hx    Colon polyps Neg Hx    Esophageal cancer Neg Hx    Rectal cancer Neg Hx    Stomach cancer Neg Hx     Social History   Socioeconomic History   Marital status: Married    Spouse name: Not on file   Number of children: 3   Years of education: Not on file   Highest education level: Not on file  Occupational History   Occupation: Curator for Engineer, production: [employer]    Comment: Retired  Tobacco Use   Smoking status: Former    Types:  Cigarettes    Start date: 01/29/1974    Quit date: 12/28/1978    Years since quitting: 44.3    Passive exposure: Past   Smokeless tobacco: Former    Quit date: 01/30/1984  Substance and Sexual Activity   Alcohol use: No   Drug use: No   Sexual activity: Not on file  Other Topics Concern   Not on file  Social History Narrative   Divorced and remarried 1990's   1 son from first marriage, 2 from second      No living will   Would want wife to be health care POA--- children are alternate   Would accept resuscitation   Not sure about tube feeds   Social Determinants of Health   Financial Resource Strain: Not on file  Food Insecurity: Not on file  Transportation Needs: Not on file  Physical Activity: Not on file  Stress: Not on file  Social Connections: Not on file  Intimate Partner Violence: Not on file   Review of Systems Appetite is good Weight is stable Sleeps well Wears seat belt Teeth okay---overdue for dentist (needs one) No heartburn or dysphagia Bowels are fine--no blood Voids okay---slower stream but okay. Empties okay No suspicious skin lesions    Objective:   Physical Exam Constitutional:      Appearance: Normal appearance.  HENT:     Mouth/Throat:     Pharynx: No oropharyngeal exudate or posterior oropharyngeal erythema.  Eyes:     Conjunctiva/sclera: Conjunctivae normal.     Pupils: Pupils are equal, round, and reactive to light.  Cardiovascular:     Rate and Rhythm: Normal rate and regular rhythm.     Pulses: Normal pulses.     Heart sounds: No murmur heard.    No gallop.  Pulmonary:     Effort: Pulmonary effort is normal.     Breath sounds: Normal breath sounds. No wheezing or rales.  Abdominal:     Palpations: Abdomen is soft.     Tenderness: There is no abdominal tenderness.  Musculoskeletal:     Cervical back: Neck supple.     Right lower leg: No edema.     Left lower leg: No edema.  Lymphadenopathy:     Cervical: No cervical adenopathy.   Skin:    Findings: No lesion or rash.  Neurological:     General: No focal deficit present.     Mental Status: He is alert and oriented to person, place, and time.     Comments: Word naming--9/1 minute Recall 3/3  Psychiatric:        Mood and Affect: Mood  normal.        Behavior: Behavior normal.            Assessment & Plan:

## 2023-05-03 NOTE — Assessment & Plan Note (Signed)
Shoulder better Discussed topical diclofenac

## 2023-05-03 NOTE — Assessment & Plan Note (Signed)
If worse, will consider jardiance given CAD history

## 2023-05-03 NOTE — Assessment & Plan Note (Signed)
BP Readings from Last 3 Encounters:  05/03/23 (!) 146/84  03/04/23 (!) 170/98  02/18/23 (!) 160/90   Usually better at home On olmesartan/HCTZ now--4025 Carvedilol 6.25 bid and amlodipine (advised to take at bedtime) Will check labs again

## 2023-05-03 NOTE — Assessment & Plan Note (Signed)
I have personally reviewed the Medicare Annual Wellness questionnaire and have noted 1. The patient's medical and social history 2. Their use of alcohol, tobacco or illicit drugs 3. Their current medications and supplements 4. The patient's functional ability including ADL's, fall risks, home safety risks and hearing or visual             impairment. 5. Diet and physical activities 6. Evidence for depression or mood disorders  The patients weight, height, BMI and visual acuity have been recorded in the chart I have made referrals, counseling and provided education to the patient based review of the above and I have provided the pt with a written personalized care plan for preventive services.  I have provided you with a copy of your personalized plan for preventive services. Please take the time to review along with your updated medication list.  Exercises regularly Colon due 2027 Will check PSA again after discussion Recommended updated COVID vaccine Flu/RSV in the fall

## 2023-05-04 ENCOUNTER — Telehealth: Payer: Self-pay

## 2023-05-04 NOTE — Telephone Encounter (Signed)
-----   Message from Karie Schwalbe, MD sent at 05/03/2023  5:00 PM EDT ----- Please call him His sugar remains on the high side--borderline for diabetes (though the A1c is okay). I would recommend starting jardiance 10mg  as we discussed. If he agrees, send Rx for #90 x 3. Either way, I want to see him back in the office in 6 months instead of a year Cholesterol remains low Mild elevation of one liver test (ALT) is not much different over the past 3 years Everything else is normal Send him a copy

## 2023-05-04 NOTE — Telephone Encounter (Signed)
Left message to call office

## 2023-05-07 NOTE — Telephone Encounter (Signed)
Called patient declined starting medication. Set up follow up. Will work on diet and exercise. Verified home address will mail copy of labs as requested.

## 2023-05-07 NOTE — Telephone Encounter (Signed)
2nd call: Left message to call office.

## 2023-08-20 ENCOUNTER — Telehealth: Payer: Self-pay

## 2023-08-20 NOTE — Patient Outreach (Signed)
  Care Coordination   08/20/2023 Name: AARNAV NEGLIA MRN: [id number] DOB: October 28, 1956   Care Coordination Outreach Attempts:  An unsuccessful telephone outreach was attempted today to offer the patient information about available care coordination services. HIPAA compliant message left.   Follow Up Plan:  Additional outreach attempts will be made to offer the patient care coordination information and services.   Encounter Outcome:  No Answer   Care Coordination Interventions:  No, not indicated    George Ina [facility] Care Coordination [phone] direct line

## 2023-11-08 ENCOUNTER — Ambulatory Visit: Payer: Medicare HMO | Admitting: Internal Medicine

## 2023-12-29 ENCOUNTER — Other Ambulatory Visit: Payer: Self-pay | Admitting: Physician Assistant

## 2023-12-29 DIAGNOSIS — I10 Essential (primary) hypertension: Secondary | ICD-10-CM

## 2024-02-06 ENCOUNTER — Other Ambulatory Visit: Payer: Self-pay | Admitting: Physician Assistant

## 2024-02-07 ENCOUNTER — Other Ambulatory Visit: Payer: Self-pay | Admitting: Physician Assistant

## 2024-02-07 DIAGNOSIS — I10 Essential (primary) hypertension: Secondary | ICD-10-CM

## 2024-02-09 ENCOUNTER — Other Ambulatory Visit: Payer: Self-pay | Admitting: Physician Assistant

## 2024-02-09 MED ORDER — OLMESARTAN MEDOXOMIL-HCTZ 40-25 MG PO TABS: 1.0000 | ORAL_TABLET | Freq: Every day | ORAL | 3 refills | Status: DC

## 2024-03-14 ENCOUNTER — Telehealth: Payer: Self-pay | Admitting: Cardiovascular Disease

## 2024-03-14 ENCOUNTER — Other Ambulatory Visit: Payer: Self-pay | Admitting: Cardiovascular Disease

## 2024-03-14 DIAGNOSIS — I10 Essential (primary) hypertension: Secondary | ICD-10-CM

## 2024-03-14 MED ORDER — AMLODIPINE BESYLATE 10 MG PO TABS
10.0000 mg | ORAL_TABLET | Freq: Every day | ORAL | 0 refills | Status: DC
Start: 2024-03-14 — End: 2024-03-21

## 2024-03-14 NOTE — Telephone Encounter (Signed)
*  STAT* If patient is at the pharmacy, call can be transferred to refill team.   1. Which medications need to be refilled? (please list name of each medication and dose if known)  Amlodipine   2. Would you like to learn more about the convenience, safety, & potential cost savings by using the [facility]?      3. Are you open to using the [facility] (Type [facility]. .   4. Which pharmacy/location (including street and city if local pharmacy) is medication to be sent to?[facility] [place] [place]   5. Do they need a 30 day or 90 day supply? 90 days and refills

## 2024-03-14 NOTE — Telephone Encounter (Signed)
 Pt's medication was sent to pt's pharmacy as requested. Confirmation received.

## 2024-03-14 NOTE — Telephone Encounter (Signed)
 New Message:      Patient said he would like for Dr [name] nurse to please give him a call please.aid he had some questions that he needed to ask. I asked him what was the questions, he said they were personal.

## 2024-03-14 NOTE — Telephone Encounter (Signed)
 Returned call to patient,   Patient states he has been trying for a while to get an appointment set up. He is overdue for follow up, but only wants to see Dr. Excell Seltzer.

## 2024-03-16 MED ORDER — CARVEDILOL 6.25 MG PO TABS
6.2500 mg | ORAL_TABLET | Freq: Two times a day (BID) | ORAL | 0 refills | Status: DC
Start: 2024-03-16 — End: 2024-07-19

## 2024-03-16 NOTE — Addendum Note (Signed)
 Addended by: Adriana Simas, Marquavius Scaife L on: 03/16/2024 11:48 AM   Modules accepted: Orders

## 2024-03-21 ENCOUNTER — Telehealth: Payer: Self-pay | Admitting: Cardiovascular Disease

## 2024-03-21 DIAGNOSIS — I10 Essential (primary) hypertension: Secondary | ICD-10-CM

## 2024-03-21 MED ORDER — AMLODIPINE BESYLATE 10 MG PO TABS
10.0000 mg | ORAL_TABLET | Freq: Every day | ORAL | 1 refills | Status: DC
Start: 2024-03-21 — End: 2024-10-06

## 2024-03-21 NOTE — Telephone Encounter (Signed)
*  STAT* If patient is at the pharmacy, call can be transferred to refill team.   1. Which medications need to be refilled? (please list name of each medication and dose if known)   amLODipine (NORVASC) 10 MG tablet    2. Which pharmacy/location (including street and city if local pharmacy) is medication to be sent to? [id number] [place], [place] - [address]   3. Do they need a 30 day or 90 day supply? 90  Patient has appt on 7/23

## 2024-05-04 ENCOUNTER — Encounter: Payer: Self-pay | Admitting: Internal Medicine

## 2024-05-04 ENCOUNTER — Ambulatory Visit: Payer: Medicare HMO | Admitting: Internal Medicine

## 2024-05-04 VITALS — BP 138/88 | HR 65 | Temp 98.8°F | Ht 65.0 in | Wt 182.0 lb

## 2024-05-04 DIAGNOSIS — Z0000 Encounter for general adult medical examination without abnormal findings: Secondary | ICD-10-CM

## 2024-05-04 DIAGNOSIS — I10 Essential (primary) hypertension: Secondary | ICD-10-CM

## 2024-05-04 DIAGNOSIS — E785 Hyperlipidemia, unspecified: Secondary | ICD-10-CM | POA: Diagnosis not present

## 2024-05-04 DIAGNOSIS — Z125 Encounter for screening for malignant neoplasm of prostate: Secondary | ICD-10-CM | POA: Diagnosis not present

## 2024-05-04 DIAGNOSIS — R7303 Prediabetes: Secondary | ICD-10-CM | POA: Diagnosis not present

## 2024-05-04 DIAGNOSIS — I2510 Atherosclerotic heart disease of native coronary artery without angina pectoris: Secondary | ICD-10-CM

## 2024-05-04 LAB — COMPREHENSIVE METABOLIC PANEL WITH GFR
ALT: 80 U/L — ABNORMAL HIGH (ref 0–53)
AST: 34 U/L (ref 0–37)
Albumin: 5 g/dL (ref 3.5–5.2)
Alkaline Phosphatase: 53 U/L (ref 39–117)
BUN: 20 mg/dL (ref 6–23)
CO2: 26 meq/L (ref 19–32)
Calcium: 9.3 mg/dL (ref 8.4–10.5)
Chloride: 100 meq/L (ref 96–112)
Creatinine, Ser: 0.93 mg/dL (ref 0.40–1.50)
GFR: 83.91 mL/min (ref >60.00)
Glucose, Bld: 131 mg/dL — ABNORMAL HIGH (ref 70–99)
Potassium: 3.7 meq/L (ref 3.5–5.1)
Sodium: 137 meq/L (ref 135–145)
Total Bilirubin: 0.9 mg/dL (ref 0.2–1.2)
Total Protein: 7.4 g/dL (ref 6.0–8.3)

## 2024-05-04 LAB — CBC
HCT: 47.4 % (ref 39.0–52.0)
Hemoglobin: 16.7 g/dL (ref 13.0–17.0)
MCHC: 35.2 g/dL (ref 30.0–36.0)
MCV: 87.3 fl (ref 78.0–100.0)
Platelets: 203 10*3/uL (ref 150.0–400.0)
RBC: 5.43 Mil/uL (ref 4.22–5.81)
RDW: 13.6 % (ref 11.5–15.5)
WBC: 5.8 10*3/uL (ref 4.0–10.5)

## 2024-05-04 LAB — LIPID PANEL
Cholesterol: 178 mg/dL (ref 0–200)
HDL: 35 mg/dL — ABNORMAL LOW (ref >39.00)
LDL Cholesterol: 103 mg/dL — ABNORMAL HIGH (ref 0–99)
NonHDL: 143.08
Total CHOL/HDL Ratio: 5
Triglycerides: 200 mg/dL — ABNORMAL HIGH (ref 0.0–149.0)
VLDL: 40 mg/dL (ref 0.0–40.0)

## 2024-05-04 LAB — HEMOGLOBIN A1C: Hgb A1c MFr Bld: 5.6 % (ref 4.6–6.5)

## 2024-05-04 LAB — PSA, MEDICARE: PSA: 0.61 ng/mL (ref 0.10–4.00)

## 2024-05-04 NOTE — Assessment & Plan Note (Signed)
 No symptoms Having trouble getting the repatha  On carvedilol  6.25 bid, ASA 81mg  daily, olmesartan /hydrochlorothiazide  or ramipril 

## 2024-05-04 NOTE — Assessment & Plan Note (Signed)
 I have personally reviewed the Medicare Annual Wellness questionnaire and have noted 1. The patient's medical and social history 2. Their use of alcohol, tobacco or illicit drugs 3. Their current medications and supplements 4. The patient's functional ability including ADL's, fall risks, home safety risks and hearing or visual             impairment. 5. Diet and physical activities 6. Evidence for depression or mood disorders  The patients weight, height, BMI and visual acuity have been recorded in the chart I have made referrals, counseling and provided education to the patient based review of the above and I have provided the pt with a written personalized care plan for preventive services.  I have provided you with a copy of your personalized plan for preventive services. Please take the time to review along with your updated medication list.  Colon due 2027 Will check PSA Goes to gym regularly Flu/COVID in the fall Needs second shingrix

## 2024-05-04 NOTE — Assessment & Plan Note (Signed)
 BP Readings from Last 3 Encounters:  05/04/24 138/88  05/03/23 (!) 146/84  03/04/23 (!) 170/98   Controlled on heart meds

## 2024-05-04 NOTE — Assessment & Plan Note (Signed)
 No meds right now If worsens, will start jardiance

## 2024-05-04 NOTE — Assessment & Plan Note (Signed)
 Intolerant of statins Will leave Rx decisions to cardiology

## 2024-05-04 NOTE — Progress Notes (Signed)
Vision Screening   Right eye Left eye Both eyes  Without correction     With correction 20/20 20/20 20/20  Hearing Screening - Comments:: Passed whisper test  

## 2024-05-04 NOTE — Progress Notes (Signed)
 Subjective:    Patient ID: [name], male    DOB: [birth year], 68 y.o.   MRN: [id number]  HPI Here for Medicare wellness visit and follow up of chronic health conditions Reviewed advanced directives Reviewed other doctors---Dr Cooper--cardiology, Dr Crisoforo Dolores, Dr Alejos Amsterdam No hospitalizations or surgery in the past year Does go to the gym regularly Vision is fair--needs exam (has to get doctor) Hearing is fine No alcohol or tobacco No falls No depression or anhedonia Independent with instrumental ADLs No memory issues  No chest pain or SOB Did have change in BP medication---control not as good (preferred ramipril  to the olmesartan /hydrochlorothiazide ) Now alternating them No dizziness or syncope No edema No palpitations  Off the repatha  for now Insurance issue Caused diarrhea Has not tolerated statins  Has corn on toe--needed steel toed shoes for work  Using corn pads Shoulder doing well No other sig joint issues  Current Outpatient Medications on File Prior to Visit  Medication Sig Dispense Refill   amLODipine  (NORVASC ) 10 MG tablet Take 1 tablet (10 mg total) by mouth daily. 90 tablet 1   aspirin 81 MG tablet Take 81 mg by mouth daily.     carvedilol  (COREG ) 6.25 MG tablet Take 1 tablet (6.25 mg total) by mouth 2 (two) times daily with a meal. 60 tablet 0   Cholecalciferol (VITAMIN D) 50 MCG (2000 UT) tablet Take 2,000 Units by mouth daily.     Coenzyme Q10 (CO Q 10 PO) Take 1 tablet by mouth daily in the afternoon.     Evolocumab  (REPATHA  SURECLICK) 140 MG/ML SOAJ Inject 140 mg into the skin every 14 (fourteen) days. 2 mL 11   Multiple Vitamin (MULTIVITAMIN) tablet Take 1 tablet by mouth daily.     NON FORMULARY CBD Oil     olmesartan -hydrochlorothiazide  (BENICAR  HCT) 40-25 MG tablet Take 1 tablet by mouth daily. 90 tablet 3   OVER THE COUNTER MEDICATION Omega Q plus Resveratrol and Turmeric     No current facility-administered medications on file prior  to visit.    Allergies  Allergen Reactions   Atorvastatin     REACTION: myalgias    Past Medical History:  Diagnosis Date   CAD (coronary artery disease)    post stent X 3 to LAD   Essential hypertension 10/07/2007   Qualifier: Diagnosis of  By: Marthe Slain    HEMOCCULT POSITIVE STOOL 06/17/2010   Qualifier: Diagnosis of  By: Marthe Slain    Hx of adenomatous colonic polyps 10/08/2021   Hyperglycemia 03/20/2010   Qualifier: Diagnosis of  By: Marthe Slain    Hyperlipidemia 10/08/2007   Qualifier: Diagnosis of  By: Sanford Crumble CMA, Darlene     Hypertension    Osteoarthritis of both knees 07/16/2008   Qualifier: Diagnosis of  By: Marthe Slain    Prediabetes    on metformin  once a day   Statin intolerance    Unspecified disorder of prostate 07/29/2010   Qualifier: Diagnosis of  By: Marthe Slain     Past Surgical History:  Procedure Laterality Date   COLONOSCOPY  2011   CORONARY STENT PLACEMENT  2002   EYE MUSCLE SURGERY     as child  3rd grade   SHOULDER SURGERY Right 10/02/2022    Family History  Problem Relation Age of Onset   Coronary artery disease Mother    Heart attack Mother        early 15's  Diabetes Mother    Hypertension Mother    Cancer Father        lung    Heart attack Brother    Heart attack Brother    Colon cancer Neg Hx    Colon polyps Neg Hx    Esophageal cancer Neg Hx    Rectal cancer Neg Hx    Stomach cancer Neg Hx     Social History   Socioeconomic History   Marital status: Married    Spouse name: Not on file   Number of children: 3   Years of education: Not on file   Highest education level: Not on file  Occupational History   Occupation: Curator for Engineer, production: [employer]    Comment: Retired  Tobacco Use   Smoking status: Former    Current packs/day: 0.00    Types: Cigarettes    Start date: 01/29/1974    Quit date: 12/28/1978    Years since quitting: 45.3    Passive  exposure: Past   Smokeless tobacco: Former    Quit date: 01/30/1984  Substance and Sexual Activity   Alcohol use: No   Drug use: No   Sexual activity: Not on file  Other Topics Concern   Not on file  Social History Narrative   Divorced and remarried 1990's   1 son from first marriage, 2 from second      No living will   Would want wife to be health care POA--- children are alternate   Would accept resuscitation   Not sure about tube feeds   Social Drivers of Corporate investment banker Strain: Not on file  Food Insecurity: Not on file  Transportation Needs: Not on file  Physical Activity: Not on file  Stress: Not on file  Social Connections: Not on file  Intimate Partner Violence: Not on file   Review of Systems Appetite is good Weight is stable--or up a bit Sleeps okay with CBD Wears seat belt Teeth are okay--needs dentist Rare heartburn--- tums will help. No dysphagia Bowels move fine--no blood Voids okay---some frequency. Does empty okay No suspicious skin lesions    Objective:   Physical Exam Constitutional:      Appearance: Normal appearance.  HENT:     Mouth/Throat:     Pharynx: No oropharyngeal exudate or posterior oropharyngeal erythema.  Eyes:     Conjunctiva/sclera: Conjunctivae normal.     Pupils: Pupils are equal, round, and reactive to light.  Cardiovascular:     Rate and Rhythm: Normal rate and regular rhythm.     Pulses: Normal pulses.     Heart sounds: No murmur heard.    No gallop.  Pulmonary:     Effort: Pulmonary effort is normal.     Breath sounds: Normal breath sounds. No wheezing or rales.  Abdominal:     Palpations: Abdomen is soft.     Tenderness: There is no abdominal tenderness.  Musculoskeletal:     Cervical back: Neck supple.     Right lower leg: No edema.     Left lower leg: No edema.  Lymphadenopathy:     Cervical: No cervical adenopathy.  Skin:    Findings: No lesion or rash.  Neurological:     General: No focal deficit  present.     Mental Status: He is alert and oriented to person, place, and time.     Comments: Mini-cog---clock fine, recall 2/3  Psychiatric:  Mood and Affect: Mood normal.        Behavior: Behavior normal.            Assessment & Plan:

## 2024-05-05 ENCOUNTER — Telehealth: Payer: Self-pay

## 2024-05-05 NOTE — Telephone Encounter (Signed)
 Copied from CRM [id number]. Topic: General - Call Back - No Documentation >> May 05, 2024 11:10 AM Alyse July wrote: Reason for CRM: Patient return call pertaining to blood work and stated there is no need to put him on medication. Please contact patient to advise.

## 2024-05-05 NOTE — Telephone Encounter (Signed)
 See lab results.

## 2024-07-19 ENCOUNTER — Encounter: Payer: Self-pay | Admitting: Cardiovascular Disease

## 2024-07-19 ENCOUNTER — Ambulatory Visit: Attending: Cardiovascular Disease | Admitting: Cardiovascular Disease

## 2024-07-19 VITALS — BP 162/96 | HR 71 | Ht 65.0 in | Wt 183.0 lb

## 2024-07-19 DIAGNOSIS — Z01810 Encounter for preprocedural cardiovascular examination: Secondary | ICD-10-CM | POA: Diagnosis not present

## 2024-07-19 DIAGNOSIS — I25118 Atherosclerotic heart disease of native coronary artery with other forms of angina pectoris: Secondary | ICD-10-CM | POA: Diagnosis not present

## 2024-07-19 DIAGNOSIS — I10 Essential (primary) hypertension: Secondary | ICD-10-CM

## 2024-07-19 DIAGNOSIS — E782 Mixed hyperlipidemia: Secondary | ICD-10-CM | POA: Diagnosis not present

## 2024-07-19 LAB — CBC

## 2024-07-19 MED ORDER — CARVEDILOL 12.5 MG PO TABS: 12.5000 mg | ORAL_TABLET | Freq: Two times a day (BID) | ORAL | 3 refills | Status: AC

## 2024-07-19 NOTE — Assessment & Plan Note (Signed)
 Blood pressure above goal.  Continue amlodipine , olmesartan , hydrochlorothiazide .  Increase carvedilol  to 12.5 mg twice daily.  Discussed the importance of weight loss and dietary modification.

## 2024-07-19 NOTE — Assessment & Plan Note (Signed)
 We will need to readdress his lipids.  I reviewed his labs today which demonstrate a cholesterol 178, LDL 103, and triglycerides of 200.  He is taking rosuvastatin  5 mg once daily.  He has had multiple side effects to lipid-lowering therapies.  Might consider referral back to lipid clinic for inclisiran.

## 2024-07-19 NOTE — Patient Instructions (Addendum)
 Medications: INCREASE Carvedilol /Coreg  to 12.5mg  twice daily  Lab Work: CBC, BMET today If you have labs (blood work) drawn today and your tests are completely normal, you will receive your results only by: MyChart Message (if you have MyChart) OR A paper copy in the mail If you have any lab test that is abnormal or we need to change your treatment, we will call you to review the results.  Testing/Procedures: Left heart Catheterization Your physician has requested that you have a cardiac catheterization. Cardiac catheterization is used to diagnose and/or treat various heart conditions. Doctors may recommend this procedure for a number of different reasons. The most common reason is to evaluate chest pain. Chest pain can be a symptom of coronary artery disease (CAD), and cardiac catheterization can show whether plaque is narrowing or blocking your heart's arteries. This procedure is also used to evaluate the valves, as well as measure the blood flow and oxygen levels in different parts of your heart. For further information please visit [email or web address] Please follow instruction sheet, as given.  Follow-Up: At [facility], you and your health needs are our priority.  As part of our continuing mission to provide you with exceptional heart care, our providers are all part of one team.  This team includes your primary Cardiologist (physician) and Advanced Practice Providers or APPs (Physician Assistants and Nurse Practitioners) who all work together to provide you with the care you need, when you need it.  Your next appointment:   3 month(s)  Provider:   APP   We recommend signing up for the patient portal called MyChart.  Sign up information is provided on this After Visit Summary.  MyChart is used to connect with patients for Virtual Visits (Telemedicine).  Patients are able to view lab/test results, encounter notes, upcoming appointments, etc.  Non-urgent messages can be sent to  your provider as well.   To learn more about what you can do with MyChart, go to [email or web address].   Other Instructions       Cardiac/Peripheral Catheterization   You are scheduled for a Cardiac Catheterization on Friday, August 1 with Dr. [name].  1. Please arrive at the [facility] (Main Entrance A) at [facility]: [address] [place], [place] [address] at 10:30 AM (This time is two hour(s) before your procedure to ensure your preparation).   Free valet parking service is available. You will check in at ADMITTING. The support person will be asked to wait in the waiting room.  It is OK to have someone drop you off and come back when you are ready to be discharged.        Special note: Every effort is made to have your procedure done on time. Please understand that emergencies sometimes delay scheduled procedures.  2. Diet: Do not eat solid foods after midnight.  You may have clear liquids until 5 AM the day of the procedure.  3. Labs: TODAY  4. Medication instructions in preparation for your procedure:   Contrast Allergy: No  DO NOT TAKE Olmesartan /hydrochlorothiazide  the day of procedure On the morning of your procedure, take Aspirin 81 mg and any morning medicines NOT listed above.  You may use sips of water.  5. Plan to go home the same day, you will only stay overnight if medically necessary. 6. You MUST have a responsible adult to drive you home. 7. An adult MUST be with you the first 24 hours after you arrive home. 8. Bring a current  list of your medications, and the last time and date medication taken. 9. Bring ID and current insurance cards. 10.Please wear clothes that are easy to get on and off and wear slip-on shoes.  Thank you for allowing us  to care for you!   -- [facility]

## 2024-07-19 NOTE — Progress Notes (Signed)
 Cardiology Office Note:    Date:  07/19/2024   ID:  [name], DOB 1956-07-09, MRN [id number]  PCP:  Jimmy Charlie FERNS, MD   [facility] Providers Cardiologist:  [name], MD     Referring MD: Jimmy Charlie FERNS, MD   Chief Complaint  Patient presents with   Coronary Artery Disease    History of Present Illness:    [name] is a 68 y.o. male with a hx of coronary artery disease, presenting for follow-up evaluation.  The patient has a history of coronary artery disease and underwent remote bare-metal stenting of the LAD with 3 stents.  Also has a history of hypertension with a whitecoat component and mixed hyperlipidemia with statin intolerance.  The patient is here alone today.  He is no longer taking Repatha  due to cost side effects of diarrhea.  He reports that for the past year he has been experiencing chest discomfort that occurs when he walks after eating.  States that it is worse after large meals.  He describes a burning and pressure-like sensation in the chest that is relieved after belching.  He has to slow down and his symptoms improve.  He goes to the gym and has started eating smaller meals before he exercises.  With small meals he still has mild symptoms.  He is compliant with his medications.  He brings in home blood pressure readings that show his blood pressure is above goal.  Systolic blood pressures are generally ranging in the 140-1 150s and diastolic blood pressures are generally ranging in the 80s and 90s.  He has no orthopnea, PND, leg edema, heart palpitations, or lightheadedness.  He does experience mild shortness of breath with activity.   Current Medications: Current Meds  Medication Sig   amLODipine  (NORVASC ) 10 MG tablet Take 1 tablet (10 mg total) by mouth daily. (Patient taking differently: Take 10 mg by mouth. Sundays)   aspirin 81 MG tablet Take 81 mg by mouth daily.   carvedilol  (COREG ) 12.5 MG tablet Take 1 tablet (12.5 mg total)  by mouth 2 (two) times daily.   Cholecalciferol (VITAMIN D) 50 MCG (2000 UT) tablet Take 2,000 Units by mouth daily.   Coenzyme Q10 (CO Q 10 PO) Take 1 tablet by mouth daily in the afternoon.   Evolocumab  (REPATHA  SURECLICK) 140 MG/ML SOAJ Inject 140 mg into the skin every 14 (fourteen) days.   Multiple Vitamin (MULTIVITAMIN) tablet Take 1 tablet by mouth daily.   NON FORMULARY CBD Oil   olmesartan -hydrochlorothiazide  (BENICAR  HCT) 40-25 MG tablet Take 1 tablet by mouth daily.   OVER THE COUNTER MEDICATION Omega Q plus Resveratrol and Turmeric   rosuvastatin  (CRESTOR ) 5 MG tablet Take 5 mg by mouth.   [DISCONTINUED] carvedilol  (COREG ) 6.25 MG tablet Take 1 tablet (6.25 mg total) by mouth 2 (two) times daily with a meal.     Allergies:   Atorvastatin   ROS:   Please see the history of present illness.    All other systems reviewed and are negative.  EKGs/Labs/Other Studies Reviewed:    The following studies were reviewed today:     EKG:   EKG Interpretation Date/Time:  Wednesday July 19 2024 08:23:26 EDT Ventricular Rate:  71 PR Interval:  180 QRS Duration:  94 QT Interval:  394 QTC Calculation: 428 R Axis:   -16  Text Interpretation: Normal sinus rhythm Septal infarct , age undetermined When compared with ECG of 08-Oct-2001 04:54, Septal infarct is now Present  Non-specific change in ST segment in Lateral leads Confirmed by Wonda Sharper [phone]) on 07/19/2024 8:27:00 AM    Recent Labs: 05/04/2024: ALT 80; BUN 20; Creatinine, Ser 0.93; Hemoglobin 16.7; Platelets 203.0; Potassium 3.7; Sodium 137  Recent Lipid Panel    Component Value Date/Time   CHOL 178 05/04/2024 0915   CHOL 181 02/18/2023 0857   TRIG 200.0 (H) 05/04/2024 0915   TRIG 79 01/03/2007 0737   HDL 35.00 (L) 05/04/2024 0915   HDL 39 (L) 02/18/2023 0857   CHOLHDL 5 05/04/2024 0915   VLDL 40.0 05/04/2024 0915   LDLCALC 103 (H) 05/04/2024 0915   LDLCALC 99 02/18/2023 0857   LDLDIRECT 34.0 05/03/2023 0914          Physical Exam:    VS:  BP (!) 162/96   Pulse 71   Ht 5' 5 (1.651 m)   Wt 183 lb (83 kg)   SpO2 98%   BMI 30.45 kg/m     Wt Readings from Last 3 Encounters:  07/19/24 183 lb (83 kg)  05/04/24 182 lb (82.6 kg)  05/03/23 180 lb (81.6 kg)     GEN:  Well nourished, well developed in no acute distress HEENT: Normal NECK: No JVD; No carotid bruits LYMPHATICS: No lymphadenopathy CARDIAC: RRR, no murmurs, rubs, gallops RESPIRATORY:  Clear to auscultation without rales, wheezing or rhonchi  ABDOMEN: Soft, non-tender, non-distended MUSCULOSKELETAL:  No edema; No deformity  SKIN: Warm and dry NEUROLOGIC:  Alert and oriented x 3 PSYCHIATRIC:  Normal affect   Assessment & Plan Essential hypertension Blood pressure above goal.  Continue amlodipine , olmesartan , hydrochlorothiazide .  Increase carvedilol  to 12.5 mg twice daily.  Discussed the importance of weight loss and dietary modification. Coronary artery disease involving native coronary artery of native heart with other form of angina pectoris [facility]) The patient reports anginal symptoms over the past year with slow progression.  He has known coronary artery disease with remote bare-metal stenting of the LAD.  He is on 2 antianginal drugs with amlodipine  and carvedilol .  We discussed potential treatment options today.  His carvedilol  will be increased to intensify his medical program.  We reviewed options of stress testing versus definitive evaluation with cardiac catheterization.  With his known CAD and typical symptoms of angina on 2 drug therapy, we decided through shared decision making conversation to proceed with cardiac catheterization and possible PCI. I have reviewed the risks, indications, and alternatives to cardiac catheterization, possible angioplasty, and stenting with the patient. Risks include but are not limited to bleeding, infection, vascular injury, stroke, myocardial infection, arrhythmia, kidney injury, radiation-related  injury in the case of prolonged fluoroscopy use, emergency cardiac surgery, and death. The patient understands the risks of serious complication is 1-2 in 1000 with diagnostic cardiac cath and 1-2% or less with angioplasty/stenting.   Mixed hyperlipidemia We will need to readdress his lipids.  I reviewed his labs today which demonstrate a cholesterol 178, LDL 103, and triglycerides of 200.  He is taking rosuvastatin  5 mg once daily.  He has had multiple side effects to lipid-lowering therapies.  Might consider referral back to lipid clinic for inclisiran. Pre-procedural cardiovascular examination We will check preprocedure labs today.  Further plans/disposition pending his cardiac catheterization results.       Informed Consent   Shared Decision Making/Informed Consent The risks [stroke (1 in 1000), death (1 in 1000), kidney failure [usually temporary] (1 in 500), bleeding (1 in 200), allergic reaction [possibly serious] (1 in 200)], benefits (diagnostic support and  management of coronary artery disease) and alternatives of a cardiac catheterization were discussed in detail with Mr. Cush and he is willing to proceed.       Medication Adjustments/Labs and Tests Ordered: Current medicines are reviewed at length with the patient today.  Concerns regarding medicines are outlined above.  Orders Placed This Encounter  Procedures   CBC   Basic metabolic panel with GFR   EKG [id number]   Meds ordered this encounter  Medications   carvedilol  (COREG ) 12.5 MG tablet    Sig: Take 1 tablet (12.5 mg total) by mouth 2 (two) times daily.    Dispense:  180 tablet    Refill:  3    Dose INCREASE    Patient Instructions  Medications: INCREASE Carvedilol /Coreg  to 12.5mg  twice daily  Lab Work: CBC, BMET today If you have labs (blood work) drawn today and your tests are completely normal, you will receive your results only by: MyChart Message (if you have MyChart) OR A paper copy in the mail If you  have any lab test that is abnormal or we need to change your treatment, we will call you to review the results.  Testing/Procedures: Left heart Catheterization Your physician has requested that you have a cardiac catheterization. Cardiac catheterization is used to diagnose and/or treat various heart conditions. Doctors may recommend this procedure for a number of different reasons. The most common reason is to evaluate chest pain. Chest pain can be a symptom of coronary artery disease (CAD), and cardiac catheterization can show whether plaque is narrowing or blocking your heart's arteries. This procedure is also used to evaluate the valves, as well as measure the blood flow and oxygen levels in different parts of your heart. For further information please visit [email or web address] Please follow instruction sheet, as given.  Follow-Up: At [facility], you and your health needs are our priority.  As part of our continuing mission to provide you with exceptional heart care, our providers are all part of one team.  This team includes your primary Cardiologist (physician) and Advanced Practice Providers or APPs (Physician Assistants and Nurse Practitioners) who all work together to provide you with the care you need, when you need it.  Your next appointment:   3 month(s)  Provider:   APP   We recommend signing up for the patient portal called MyChart.  Sign up information is provided on this After Visit Summary.  MyChart is used to connect with patients for Virtual Visits (Telemedicine).  Patients are able to view lab/test results, encounter notes, upcoming appointments, etc.  Non-urgent messages can be sent to your provider as well.   To learn more about what you can do with MyChart, go to [email or web address].   Other Instructions       Cardiac/Peripheral Catheterization   You are scheduled for a Cardiac Catheterization on Friday, August 1 with Dr. [name].  1. Please  arrive at the [facility] (Main Entrance A) at [facility]: [address] [place], [place] [address] at 10:30 AM (This time is two hour(s) before your procedure to ensure your preparation).   Free valet parking service is available. You will check in at ADMITTING. The support person will be asked to wait in the waiting room.  It is OK to have someone drop you off and come back when you are ready to be discharged.        Special note: Every effort is made to have your procedure done on time. Please  understand that emergencies sometimes delay scheduled procedures.  2. Diet: Do not eat solid foods after midnight.  You may have clear liquids until 5 AM the day of the procedure.  3. Labs: TODAY  4. Medication instructions in preparation for your procedure:   Contrast Allergy: No  DO NOT TAKE Olmesartan /hydrochlorothiazide  the day of procedure On the morning of your procedure, take Aspirin 81 mg and any morning medicines NOT listed above.  You may use sips of water.  5. Plan to go home the same day, you will only stay overnight if medically necessary. 6. You MUST have a responsible adult to drive you home. 7. An adult MUST be with you the first 24 hours after you arrive home. 8. Bring a current list of your medications, and the last time and date medication taken. 9. Bring ID and current insurance cards. 10.Please wear clothes that are easy to get on and off and wear slip-on shoes.  Thank you for allowing us  to care for you!   -- [facility]    Signed, [name], MD  07/19/2024 1:22 PM     [facility]

## 2024-07-19 NOTE — H&P (View-Only) (Signed)
 Cardiology Office Note:    Date:  07/19/2024   ID:  [name], DOB 1956-07-09, MRN [id number]  PCP:  Jimmy Charlie FERNS, MD   [facility] Providers Cardiologist:  [name], MD     Referring MD: Jimmy Charlie FERNS, MD   Chief Complaint  Patient presents with   Coronary Artery Disease    History of Present Illness:    [name] is a 68 y.o. male with a hx of coronary artery disease, presenting for follow-up evaluation.  The patient has a history of coronary artery disease and underwent remote bare-metal stenting of the LAD with 3 stents.  Also has a history of hypertension with a whitecoat component and mixed hyperlipidemia with statin intolerance.  The patient is here alone today.  He is no longer taking Repatha  due to cost side effects of diarrhea.  He reports that for the past year he has been experiencing chest discomfort that occurs when he walks after eating.  States that it is worse after large meals.  He describes a burning and pressure-like sensation in the chest that is relieved after belching.  He has to slow down and his symptoms improve.  He goes to the gym and has started eating smaller meals before he exercises.  With small meals he still has mild symptoms.  He is compliant with his medications.  He brings in home blood pressure readings that show his blood pressure is above goal.  Systolic blood pressures are generally ranging in the 140-1 150s and diastolic blood pressures are generally ranging in the 80s and 90s.  He has no orthopnea, PND, leg edema, heart palpitations, or lightheadedness.  He does experience mild shortness of breath with activity.   Current Medications: Current Meds  Medication Sig   amLODipine  (NORVASC ) 10 MG tablet Take 1 tablet (10 mg total) by mouth daily. (Patient taking differently: Take 10 mg by mouth. Sundays)   aspirin 81 MG tablet Take 81 mg by mouth daily.   carvedilol  (COREG ) 12.5 MG tablet Take 1 tablet (12.5 mg total)  by mouth 2 (two) times daily.   Cholecalciferol (VITAMIN D) 50 MCG (2000 UT) tablet Take 2,000 Units by mouth daily.   Coenzyme Q10 (CO Q 10 PO) Take 1 tablet by mouth daily in the afternoon.   Evolocumab  (REPATHA  SURECLICK) 140 MG/ML SOAJ Inject 140 mg into the skin every 14 (fourteen) days.   Multiple Vitamin (MULTIVITAMIN) tablet Take 1 tablet by mouth daily.   NON FORMULARY CBD Oil   olmesartan -hydrochlorothiazide  (BENICAR  HCT) 40-25 MG tablet Take 1 tablet by mouth daily.   OVER THE COUNTER MEDICATION Omega Q plus Resveratrol and Turmeric   rosuvastatin  (CRESTOR ) 5 MG tablet Take 5 mg by mouth.   [DISCONTINUED] carvedilol  (COREG ) 6.25 MG tablet Take 1 tablet (6.25 mg total) by mouth 2 (two) times daily with a meal.     Allergies:   Atorvastatin   ROS:   Please see the history of present illness.    All other systems reviewed and are negative.  EKGs/Labs/Other Studies Reviewed:    The following studies were reviewed today:     EKG:   EKG Interpretation Date/Time:  Wednesday July 19 2024 08:23:26 EDT Ventricular Rate:  71 PR Interval:  180 QRS Duration:  94 QT Interval:  394 QTC Calculation: 428 R Axis:   -16  Text Interpretation: Normal sinus rhythm Septal infarct , age undetermined When compared with ECG of 08-Oct-2001 04:54, Septal infarct is now Present  Non-specific change in ST segment in Lateral leads Confirmed by Wonda Sharper [phone]) on 07/19/2024 8:27:00 AM    Recent Labs: 05/04/2024: ALT 80; BUN 20; Creatinine, Ser 0.93; Hemoglobin 16.7; Platelets 203.0; Potassium 3.7; Sodium 137  Recent Lipid Panel    Component Value Date/Time   CHOL 178 05/04/2024 0915   CHOL 181 02/18/2023 0857   TRIG 200.0 (H) 05/04/2024 0915   TRIG 79 01/03/2007 0737   HDL 35.00 (L) 05/04/2024 0915   HDL 39 (L) 02/18/2023 0857   CHOLHDL 5 05/04/2024 0915   VLDL 40.0 05/04/2024 0915   LDLCALC 103 (H) 05/04/2024 0915   LDLCALC 99 02/18/2023 0857   LDLDIRECT 34.0 05/03/2023 0914          Physical Exam:    VS:  BP (!) 162/96   Pulse 71   Ht 5' 5 (1.651 m)   Wt 183 lb (83 kg)   SpO2 98%   BMI 30.45 kg/m     Wt Readings from Last 3 Encounters:  07/19/24 183 lb (83 kg)  05/04/24 182 lb (82.6 kg)  05/03/23 180 lb (81.6 kg)     GEN:  Well nourished, well developed in no acute distress HEENT: Normal NECK: No JVD; No carotid bruits LYMPHATICS: No lymphadenopathy CARDIAC: RRR, no murmurs, rubs, gallops RESPIRATORY:  Clear to auscultation without rales, wheezing or rhonchi  ABDOMEN: Soft, non-tender, non-distended MUSCULOSKELETAL:  No edema; No deformity  SKIN: Warm and dry NEUROLOGIC:  Alert and oriented x 3 PSYCHIATRIC:  Normal affect   Assessment & Plan Essential hypertension Blood pressure above goal.  Continue amlodipine , olmesartan , hydrochlorothiazide .  Increase carvedilol  to 12.5 mg twice daily.  Discussed the importance of weight loss and dietary modification. Coronary artery disease involving native coronary artery of native heart with other form of angina pectoris [facility]) The patient reports anginal symptoms over the past year with slow progression.  He has known coronary artery disease with remote bare-metal stenting of the LAD.  He is on 2 antianginal drugs with amlodipine  and carvedilol .  We discussed potential treatment options today.  His carvedilol  will be increased to intensify his medical program.  We reviewed options of stress testing versus definitive evaluation with cardiac catheterization.  With his known CAD and typical symptoms of angina on 2 drug therapy, we decided through shared decision making conversation to proceed with cardiac catheterization and possible PCI. I have reviewed the risks, indications, and alternatives to cardiac catheterization, possible angioplasty, and stenting with the patient. Risks include but are not limited to bleeding, infection, vascular injury, stroke, myocardial infection, arrhythmia, kidney injury, radiation-related  injury in the case of prolonged fluoroscopy use, emergency cardiac surgery, and death. The patient understands the risks of serious complication is 1-2 in 1000 with diagnostic cardiac cath and 1-2% or less with angioplasty/stenting.   Mixed hyperlipidemia We will need to readdress his lipids.  I reviewed his labs today which demonstrate a cholesterol 178, LDL 103, and triglycerides of 200.  He is taking rosuvastatin  5 mg once daily.  He has had multiple side effects to lipid-lowering therapies.  Might consider referral back to lipid clinic for inclisiran. Pre-procedural cardiovascular examination We will check preprocedure labs today.  Further plans/disposition pending his cardiac catheterization results.       Informed Consent   Shared Decision Making/Informed Consent The risks [stroke (1 in 1000), death (1 in 1000), kidney failure [usually temporary] (1 in 500), bleeding (1 in 200), allergic reaction [possibly serious] (1 in 200)], benefits (diagnostic support and  management of coronary artery disease) and alternatives of a cardiac catheterization were discussed in detail with Mr. Cush and he is willing to proceed.       Medication Adjustments/Labs and Tests Ordered: Current medicines are reviewed at length with the patient today.  Concerns regarding medicines are outlined above.  Orders Placed This Encounter  Procedures   CBC   Basic metabolic panel with GFR   EKG [id number]   Meds ordered this encounter  Medications   carvedilol  (COREG ) 12.5 MG tablet    Sig: Take 1 tablet (12.5 mg total) by mouth 2 (two) times daily.    Dispense:  180 tablet    Refill:  3    Dose INCREASE    Patient Instructions  Medications: INCREASE Carvedilol /Coreg  to 12.5mg  twice daily  Lab Work: CBC, BMET today If you have labs (blood work) drawn today and your tests are completely normal, you will receive your results only by: MyChart Message (if you have MyChart) OR A paper copy in the mail If you  have any lab test that is abnormal or we need to change your treatment, we will call you to review the results.  Testing/Procedures: Left heart Catheterization Your physician has requested that you have a cardiac catheterization. Cardiac catheterization is used to diagnose and/or treat various heart conditions. Doctors may recommend this procedure for a number of different reasons. The most common reason is to evaluate chest pain. Chest pain can be a symptom of coronary artery disease (CAD), and cardiac catheterization can show whether plaque is narrowing or blocking your heart's arteries. This procedure is also used to evaluate the valves, as well as measure the blood flow and oxygen levels in different parts of your heart. For further information please visit [email or web address] Please follow instruction sheet, as given.  Follow-Up: At [facility], you and your health needs are our priority.  As part of our continuing mission to provide you with exceptional heart care, our providers are all part of one team.  This team includes your primary Cardiologist (physician) and Advanced Practice Providers or APPs (Physician Assistants and Nurse Practitioners) who all work together to provide you with the care you need, when you need it.  Your next appointment:   3 month(s)  Provider:   APP   We recommend signing up for the patient portal called MyChart.  Sign up information is provided on this After Visit Summary.  MyChart is used to connect with patients for Virtual Visits (Telemedicine).  Patients are able to view lab/test results, encounter notes, upcoming appointments, etc.  Non-urgent messages can be sent to your provider as well.   To learn more about what you can do with MyChart, go to [email or web address].   Other Instructions       Cardiac/Peripheral Catheterization   You are scheduled for a Cardiac Catheterization on Friday, August 1 with Dr. [name].  1. Please  arrive at the [facility] (Main Entrance A) at [facility]: [address] [place], [place] [address] at 10:30 AM (This time is two hour(s) before your procedure to ensure your preparation).   Free valet parking service is available. You will check in at ADMITTING. The support person will be asked to wait in the waiting room.  It is OK to have someone drop you off and come back when you are ready to be discharged.        Special note: Every effort is made to have your procedure done on time. Please  understand that emergencies sometimes delay scheduled procedures.  2. Diet: Do not eat solid foods after midnight.  You may have clear liquids until 5 AM the day of the procedure.  3. Labs: TODAY  4. Medication instructions in preparation for your procedure:   Contrast Allergy: No  DO NOT TAKE Olmesartan /hydrochlorothiazide  the day of procedure On the morning of your procedure, take Aspirin 81 mg and any morning medicines NOT listed above.  You may use sips of water.  5. Plan to go home the same day, you will only stay overnight if medically necessary. 6. You MUST have a responsible adult to drive you home. 7. An adult MUST be with you the first 24 hours after you arrive home. 8. Bring a current list of your medications, and the last time and date medication taken. 9. Bring ID and current insurance cards. 10.Please wear clothes that are easy to get on and off and wear slip-on shoes.  Thank you for allowing us  to care for you!   -- [facility]    Signed, [name], MD  07/19/2024 1:22 PM     [facility]

## 2024-07-20 ENCOUNTER — Ambulatory Visit: Payer: Self-pay | Admitting: Cardiovascular Disease

## 2024-07-20 LAB — CBC
Hematocrit: 48.6 (ref 37.5–51.0)
Hemoglobin: 16.7 g/dL (ref 13.0–17.7)
MCH: 31 pg (ref 26.6–33.0)
MCHC: 34.4 g/dL (ref 31.5–35.7)
MCV: 90 fL (ref 79–97)
Platelets: 213 x10E3/uL (ref 150–450)
RBC: 5.38 x10E6/uL (ref 4.14–5.80)
RDW: 13.6 (ref 11.6–15.4)
WBC: 6.6 x10E3/uL (ref 3.4–10.8)

## 2024-07-20 LAB — BASIC METABOLIC PANEL WITH GFR
BUN/Creatinine Ratio: 18 (ref 10–24)
BUN: 17 mg/dL (ref 8–27)
CO2: 20 mmol/L (ref 20–29)
Calcium: 9.5 mg/dL (ref 8.6–10.2)
Chloride: 101 mmol/L (ref 96–106)
Creatinine, Ser: 0.96 mg/dL (ref 0.76–1.27)
Glucose: 137 mg/dL — AB (ref 70–99)
Potassium: 3.9 mmol/L (ref 3.5–5.2)
Sodium: 141 mmol/L (ref 134–144)
eGFR: 86 mL/min/1.73 (ref >59)

## 2024-07-26 ENCOUNTER — Telehealth: Payer: Self-pay | Admitting: *Deleted

## 2024-07-26 NOTE — Telephone Encounter (Signed)
 Cardiac Catheterization scheduled at [facility] for: Friday July 28, 2024 12 Noon Arrival time [facility] Main Entrance A at: 10 AM  Diet: -May have light meal until 6 AM. ( 6 hours before procedure time) Light meal consist of plain toast, fruit, light soups, crackers.  Hydration: -May drink clear liquids until leaving for hospital. Stay well hydrated! Drink 16 oz bottle of water on the way to the hospital.  Approved liquids: Water, clear tea, black coffee, clear fruit juices, non-citric and without pulp, carbonated beverages, Gatorade.  Medication instructions: -Hold:  Olmesartan -hydrochlorothiazide -AM of procedure -Other usual morning medications can be taken including aspirin 81 mg.  Plan to go home the same day, you will only stay overnight if medically necessary.  You must have responsible adult to drive you home.  Someone must be with you the first 24 hours after you arrive home.  Reviewed procedure instructions with patient.

## 2024-07-28 ENCOUNTER — Ambulatory Visit (HOSPITAL_COMMUNITY)
Admission: RE | Admit: 2024-07-28 | Discharge: 2024-07-28 | Disposition: A | Discharge status: Home / Self Care | Attending: Cardiovascular Disease | Admitting: Cardiovascular Disease

## 2024-07-28 ENCOUNTER — Encounter (HOSPITAL_COMMUNITY)
Admission: RE | Disposition: A | Discharge status: Home / Self Care | Payer: Self-pay | Source: Home / Self Care | Attending: Cardiovascular Disease

## 2024-07-28 ENCOUNTER — Other Ambulatory Visit: Payer: Self-pay

## 2024-07-28 DIAGNOSIS — Z79899 Other long term (current) drug therapy: Secondary | ICD-10-CM | POA: Insufficient documentation

## 2024-07-28 DIAGNOSIS — I25119 Atherosclerotic heart disease of native coronary artery with unspecified angina pectoris: Secondary | ICD-10-CM

## 2024-07-28 DIAGNOSIS — E782 Mixed hyperlipidemia: Secondary | ICD-10-CM | POA: Diagnosis not present

## 2024-07-28 DIAGNOSIS — Z955 Presence of coronary angioplasty implant and graft: Secondary | ICD-10-CM | POA: Diagnosis not present

## 2024-07-28 DIAGNOSIS — I10 Essential (primary) hypertension: Secondary | ICD-10-CM | POA: Diagnosis not present

## 2024-07-28 DIAGNOSIS — Z006 Encounter for examination for normal comparison and control in clinical research program: Secondary | ICD-10-CM

## 2024-07-28 DIAGNOSIS — I2582 Chronic total occlusion of coronary artery: Secondary | ICD-10-CM | POA: Diagnosis not present

## 2024-07-28 HISTORY — PX: LEFT HEART CATH AND CORONARY ANGIOGRAPHY: CATH118249

## 2024-07-28 SURGERY — LEFT HEART CATH AND CORONARY ANGIOGRAPHY
Anesthesia: LOCAL

## 2024-07-28 MED ORDER — SODIUM CHLORIDE 0.9 % IV SOLN: 250.0000 mL | INTRAVENOUS | Status: DC | PRN

## 2024-07-28 MED ORDER — LIDOCAINE HCL (PF) 1 % IJ SOLN
INTRAMUSCULAR | Status: AC
  Filled 2024-07-28: qty 30

## 2024-07-28 MED ORDER — VERAPAMIL HCL 2.5 MG/ML IV SOLN
INTRAVENOUS | Status: AC
Start: 2024-07-28 — End: 2024-07-28
  Filled 2024-07-28: qty 2

## 2024-07-28 MED ORDER — MIDAZOLAM HCL 2 MG/2ML IJ SOLN
INTRAMUSCULAR | Status: AC
  Filled 2024-07-28: qty 2

## 2024-07-28 MED ORDER — FENTANYL CITRATE (PF) 100 MCG/2ML IJ SOLN
INTRAMUSCULAR | Status: DC | PRN
  Administered 2024-07-28: 25 ug via INTRAVENOUS

## 2024-07-28 MED ORDER — LABETALOL HCL 5 MG/ML IV SOLN: 10.0000 mg | INTRAVENOUS | Status: DC | PRN

## 2024-07-28 MED ORDER — HEPARIN SODIUM (PORCINE) 1000 UNIT/ML IJ SOLN
INTRAMUSCULAR | Status: AC
Start: 2024-07-28 — End: 2024-07-28
  Filled 2024-07-28: qty 10

## 2024-07-28 MED ORDER — FREE WATER: 500.0000 mL | Freq: Once | Status: DC

## 2024-07-28 MED ORDER — ONDANSETRON HCL 4 MG/2ML IJ SOLN: 4.0000 mg | Freq: Four times a day (QID) | INTRAMUSCULAR | Status: DC | PRN

## 2024-07-28 MED ORDER — SODIUM CHLORIDE 0.9% FLUSH: 3.0000 mL | INTRAVENOUS | Status: DC | PRN

## 2024-07-28 MED ORDER — ACETAMINOPHEN 325 MG PO TABS: 650.0000 mg | ORAL_TABLET | ORAL | Status: DC | PRN

## 2024-07-28 MED ORDER — HYDRALAZINE HCL 20 MG/ML IJ SOLN: 10.0000 mg | INTRAMUSCULAR | Status: DC | PRN

## 2024-07-28 MED ORDER — MIDAZOLAM HCL 2 MG/2ML IJ SOLN
INTRAMUSCULAR | Status: DC | PRN
Start: 2024-07-28 — End: 2024-07-28
  Administered 2024-07-28: 2 mg via INTRAVENOUS

## 2024-07-28 MED ORDER — VERAPAMIL HCL 2.5 MG/ML IV SOLN
INTRAVENOUS | Status: DC | PRN
  Administered 2024-07-28: 10 mL via INTRA_ARTERIAL

## 2024-07-28 MED ORDER — ASPIRIN 81 MG PO CHEW: 81.0000 mg | CHEWABLE_TABLET | ORAL | Status: DC

## 2024-07-28 MED ORDER — FENTANYL CITRATE (PF) 100 MCG/2ML IJ SOLN
INTRAMUSCULAR | Status: AC
Start: 2024-07-28 — End: 2024-07-28
  Filled 2024-07-28: qty 2

## 2024-07-28 MED ORDER — HEPARIN (PORCINE) IN NACL 1000-0.9 UT/500ML-% IV SOLN
INTRAVENOUS | Status: DC | PRN
  Administered 2024-07-28: 500 mL

## 2024-07-28 MED ORDER — IOHEXOL 350 MG/ML SOLN
INTRAVENOUS | Status: DC | PRN
  Administered 2024-07-28: 50 mL

## 2024-07-28 MED ORDER — SODIUM CHLORIDE 0.9% FLUSH: 3.0000 mL | Freq: Two times a day (BID) | INTRAVENOUS | Status: DC

## 2024-07-28 MED ORDER — HEPARIN SODIUM (PORCINE) 1000 UNIT/ML IJ SOLN
INTRAMUSCULAR | Status: DC | PRN
  Administered 2024-07-28: 5000 [IU] via INTRAVENOUS

## 2024-07-28 MED ORDER — LIDOCAINE HCL (PF) 1 % IJ SOLN
INTRAMUSCULAR | Status: DC | PRN
  Administered 2024-07-28: 2 mL via INTRADERMAL

## 2024-07-28 SURGICAL SUPPLY — 6 items
CATH 5FR JL3.5 JR4 ANG PIG MP (CATHETERS) IMPLANT
DEVICE RAD COMP TR BAND LRG (VASCULAR PRODUCTS) IMPLANT
GLIDESHEATH SLEND SS 6F .021 (SHEATH) IMPLANT
GUIDEWIRE INQWIRE 1.5J.035X260 (WIRE) IMPLANT
PACK CARDIAC CATHETERIZATION (CUSTOM PROCEDURE TRAY) ×1 IMPLANT
SET ATX-X65L (MISCELLANEOUS) IMPLANT

## 2024-07-28 NOTE — Interval H&P Note (Signed)
 History and Physical Interval Note:  07/28/2024 2:47 PM  [name]  has presented today for surgery, with the diagnosis of unstable angina.  The various methods of treatment have been discussed with the patient and family. After consideration of risks, benefits and other options for treatment, the patient has consented to  Procedure(s): LEFT HEART CATH AND CORONARY ANGIOGRAPHY (N/A) as a surgical intervention.  The patient's history has been reviewed, patient examined, no change in status, stable for surgery.  I have reviewed the patient's chart and labs.  Questions were answered to the patient's satisfaction.     [name]

## 2024-07-28 NOTE — Progress Notes (Signed)
 TR band removed, transparent gauze dressing put in place. Remains clean dry and in tact.

## 2024-07-28 NOTE — Discharge Instructions (Signed)

## 2024-07-28 NOTE — Research (Addendum)
 Prevail Informed Consent   Subject Name: [name]  Subject met inclusion and exclusion criteria.  The informed consent form, study requirements and expectations were reviewed with the subject and questions and concerns were addressed prior to the signing of the consent form.  The subject verbalized understanding of the trial requirements.  The subject agreed to participate in the Prevail trial and signed the informed consent at 0945 on 07/28/2024.  The informed consent was obtained prior to performance of any protocol-specific procedures for the subject.  A copy of the signed informed consent was given to the subject and a copy was placed in the subject's medical record.   [name]    Screen fail

## 2024-07-29 ENCOUNTER — Encounter (HOSPITAL_COMMUNITY): Payer: Self-pay | Admitting: Cardiovascular Disease

## 2024-07-31 ENCOUNTER — Other Ambulatory Visit: Payer: Self-pay | Admitting: Cardiology

## 2024-07-31 DIAGNOSIS — I2510 Atherosclerotic heart disease of native coronary artery without angina pectoris: Secondary | ICD-10-CM

## 2024-08-02 ENCOUNTER — Ambulatory Visit (HOSPITAL_COMMUNITY)
Admission: RE | Admit: 2024-08-02 | Discharge: 2024-08-02 | Disposition: A | Discharge status: Home / Self Care | Source: Ambulatory Visit | Attending: Cardiovascular Disease | Admitting: Cardiovascular Disease

## 2024-08-02 DIAGNOSIS — I2510 Atherosclerotic heart disease of native coronary artery without angina pectoris: Secondary | ICD-10-CM | POA: Insufficient documentation

## 2024-08-02 LAB — ECHOCARDIOGRAM COMPLETE
Area-P 1/2: 3.19 cm2
S' Lateral: 3.67 cm

## 2024-08-07 ENCOUNTER — Ambulatory Visit: Payer: Self-pay | Admitting: Cardiovascular Disease

## 2024-08-10 NOTE — Progress Notes (Signed)
 [address]       [place] [address]             [phone]        WILBON OBENCHAIN [facility] Medical Record #[id number] Date of Birth: [birth year]  Referring: Wonda Sharper, MD Primary Care: Jimmy Charlie FERNS, MD Primary Cardiologist:Michael Wonda, MD  Chief Complaint:    Chief Complaint  Patient presents with   Coronary Artery Disease    New patient consultation, CATH 8/1, ECHO 8/6    History of Present Illness:     [name] is a 68 y.o. male who presents for surgical evaluation of 3V CAD.  He has a hx of PCI 31yrs ago, and has been re-evaluated due to worsening shortness of breath and fatigue.  He states that currently, he is asymptomatic, and works out at [employer] on a regular basis.  He does admit to some shortness of breath if he has his breakfast too close to his work-outs, but this typically resolves with belching.     Past Medical and Surgical History: Previous Chest Surgery: no Previous Chest Radiation: no Diabetes Mellitus: no.  HbA1C 5.6 Creatinine:  Lab Results  Component Value Date   CREATININE 0.96 07/19/2024   CREATININE 0.93 05/04/2024   CREATININE 0.96 05/03/2023     Past Medical History:  Diagnosis Date   CAD (coronary artery disease)    post stent X 3 to LAD   Essential hypertension 10/07/2007   Qualifier: Diagnosis of  By: Georgian ROSALEA CHARM Lamar    HEMOCCULT POSITIVE STOOL 06/17/2010   Qualifier: Diagnosis of  By: Georgian ROSALEA CHARM Lamar    Hx of adenomatous colonic polyps 10/08/2021   Hyperglycemia 03/20/2010   Qualifier: Diagnosis of  By: Georgian ROSALEA CHARM Lamar    Hyperlipidemia 10/08/2007   Qualifier: Diagnosis of  By: Anice CMA, Darlene     Hypertension    Osteoarthritis of both knees 07/16/2008   Qualifier: Diagnosis of  By: Georgian ROSALEA CHARM Lamar    Prediabetes    on metformin once a day   Statin intolerance    Unspecified disorder of prostate 07/29/2010   Qualifier: Diagnosis of  By: Georgian ROSALEA CHARM Lamar     Past Surgical  History:  Procedure Laterality Date   COLONOSCOPY  2011   CORONARY STENT PLACEMENT  2002   EYE MUSCLE SURGERY     as child  3rd grade   LEFT HEART CATH AND CORONARY ANGIOGRAPHY N/A 07/28/2024   Procedure: LEFT HEART CATH AND CORONARY ANGIOGRAPHY;  Surgeon: Wonda Sharper, MD;  Location: [facility] INVASIVE CV LAB;  Service: Cardiovascular;  Laterality: N/A;   SHOULDER SURGERY Right 10/02/2022    Social History:  Social History   Tobacco Use  Smoking Status Former   Current packs/day: 0.00   Types: Cigarettes   Start date: 01/29/1974   Quit date: 12/28/1978   Years since quitting: 45.6   Passive exposure: Past  Smokeless Tobacco Former   Quit date: 01/30/1984    Social History   Substance and Sexual Activity  Alcohol Use No     Allergies  Allergen Reactions   Atorvastatin     REACTION: myalgias    Medications:   Current Outpatient Medications  Medication Sig Dispense Refill   amLODipine (NORVASC) 10 MG tablet Take 1 tablet (10 mg total) by mouth daily. 90 tablet 1   aspirin 81 MG tablet Take 81 mg by mouth daily.  carvedilol (COREG) 12.5 MG tablet Take 1 tablet (12.5 mg total) by mouth 2 (two) times daily. 180 tablet 3   Cholecalciferol (VITAMIN D) 50 MCG (2000 UT) tablet Take 2,000 Units by mouth daily.     Coenzyme Q10 (CO Q 10 PO) Take 1 tablet by mouth daily in the afternoon.     Multiple Vitamin (MULTIVITAMIN) tablet Take 1 tablet by mouth daily.     NON FORMULARY Take 2 each by mouth at bedtime. CBD Oil Gummies     olmesartan-hydrochlorothiazide (BENICAR HCT) 40-25 MG tablet Take 1 tablet by mouth daily. 90 tablet 3   rosuvastatin (CRESTOR) 5 MG tablet Take 5 mg by mouth once a week.     No current facility-administered medications for this visit.    (Not in a hospital admission)   Family History  Problem Relation Age of Onset   Coronary artery disease Mother    Heart attack Mother        early 47's   Diabetes Mother    Hypertension Mother    Cancer Father         lung    Heart attack Brother    Heart attack Brother    Colon cancer Neg Hx    Colon polyps Neg Hx    Esophageal cancer Neg Hx    Rectal cancer Neg Hx    Stomach cancer Neg Hx      Review of Systems:   Review of Systems  Constitutional:  Positive for malaise/fatigue.  Respiratory:  Negative for shortness of breath.   Cardiovascular:  Negative for chest pain.  Neurological: Negative.       Physical Exam: BP (!) 186/97 (BP Location: Left Arm, Patient Position: Sitting, Cuff Size: Normal)   Pulse 68   Resp 20   Ht 5' 5 (1.651 m)   Wt 182 lb 8 oz (82.8 kg)   SpO2 95% Comment: RA  BMI 30.37 kg/m  Physical Exam Constitutional:      General: He is not in acute distress.    Appearance: He is not ill-appearing.  HENT:     Head: Normocephalic and atraumatic.  Eyes:     Extraocular Movements: Extraocular movements intact.  Cardiovascular:     Rate and Rhythm: Normal rate.  Pulmonary:     Effort: Pulmonary effort is normal. No respiratory distress.  Abdominal:     General: Abdomen is flat. There is no distension.  Musculoskeletal:        General: Normal range of motion.     Cervical back: Normal range of motion.  Skin:    General: Skin is warm and dry.  Neurological:     General: No focal deficit present.     Mental Status: He is alert and oriented to person, place, and time.       Diagnostic Studies & Laboratory data: Cardiac Studies & Procedures   ______________________________________________________________________________________________ CARDIAC CATHETERIZATION  CARDIAC CATHETERIZATION 07/28/2024  Conclusion   RPDA lesion is 90% stenosed.   LV end diastolic pressure is normal.  1.  Patent left main with no stenosis 2.  Total occlusion of the mid LAD within the old stented segment (bare-metal stents from 2002) 3.  Moderately severe mid circumflex and first OM stenoses of 60 to 70% 4.  Chronic total occlusion of the RCA with left-to-right  collaterals 5.  Normal LVEDP  Recommendations: The patient last underwent cardiac catheterization and PCI over 20 years ago.  He has developed severe diffuse multivessel CAD with total occlusion  of the LAD and right coronary artery.  He appears to have a good obtuse marginal and diagonal targets for surgery, then somewhat borderline distal LAD and PDA targets.  I am going to refer him for outpatient cardiac surgical consultation.  Will continue to treat him medically in the interim.  Discussed findings with the patient and his son.  Findings Coronary Findings Diagnostic  Dominance: Right  Left Anterior Descending Prox LAD to Mid LAD lesion is 100% stenosed. The lesion was previously treated . Dist LAD lesion is 60% stenosed.  Left Circumflex Mid Cx lesion is 70% stenosed.  First Obtuse Marginal Branch 1st Mrg lesion is 70% stenosed.  Right Coronary Artery Dominant vessel, total occlusion with some right to right and primarily supplied by left-to-right collaterals Prox RCA lesion is 100% stenosed.  Right Posterior Descending Artery Collaterals RPDA filled by collaterals from 1st Sept.  Collaterals RPDA filled by collaterals from 2nd Sept.  RPDA lesion is 90% stenosed.  Intervention  No interventions have been documented.     ECHOCARDIOGRAM  ECHOCARDIOGRAM COMPLETE 08/02/2024  Narrative ECHOCARDIOGRAM REPORT    Patient Name:   [name] Date of Exam: 08/02/2024 Medical Rec #:  [id number]     Height:       65.0 in Accession #:    [id number]    Weight:       183.0 lb Date of Birth:  1956/04/23     BSA:          1.905 m Patient Age:    68 years      BP:           162/96 mmHg Patient Gender: M             HR:           61 bpm. Exam Location:  [address]  Procedure: 2D Echo, 3D Echo, Cardiac Doppler and Color Doppler (Both Spectral and Color Flow Doppler were utilized during procedure).  Indications:    I25.10 CAD  History:        Patient has no prior history of  Echocardiogram examinations. CAD; Risk Factors:Family History of Coronary Artery Disease, Hypertension, Dyslipidemia and Former Smoker.  Sonographer:    Heather Hawks RDCS Referring Phys: CHARLIE LILLETTE DENISE  IMPRESSIONS   1. Left ventricular ejection fraction, by estimation, is 50 to 55%. The left ventricle has low normal function. The left ventricle demonstrates regional wall motion abnormalities (see scoring diagram/findings for description). There is mild left ventricular hypertrophy. Left ventricular diastolic parameters are consistent with Grade I diastolic dysfunction (impaired relaxation). There is mild hypokinesis of the left ventricular, mid-apical inferoseptal wall, apical segment and inferior wall. 2. Right ventricular systolic function is normal. The right ventricular size is normal. 3. Left atrial size was moderately dilated. 4. Right atrial size was mildly dilated. 5. The mitral valve is normal in structure. Trivial mitral valve regurgitation. No evidence of mitral stenosis. 6. The aortic valve is tricuspid. Aortic valve regurgitation is not visualized. No aortic stenosis is present. 7. The inferior vena cava is normal in size with greater than 50% respiratory variability, suggesting right atrial pressure of 3 mmHg.  Comparison(s): No prior Echocardiogram.  Conclusion(s)/Recommendation(s): Low normal EF with focal mild hypokinesis mid to apical inferior, inferoseptal, and apical walls.  FINDINGS Left Ventricle: Left ventricular ejection fraction, by estimation, is 50 to 55%. The left ventricle has low normal function. The left ventricle demonstrates regional wall motion abnormalities. Mild hypokinesis of the left ventricular, mid-apical inferoseptal  wall, apical segment and inferior wall. 3D ejection fraction reviewed and evaluated as part of the interpretation. Alternate measurement of EF is felt to be most reflective of LV function. The left ventricular internal cavity size  was normal in size. There is mild left ventricular hypertrophy. Left ventricular diastolic parameters are consistent with Grade I diastolic dysfunction (impaired relaxation).  Right Ventricle: The right ventricular size is normal. No increase in right ventricular wall thickness. Right ventricular systolic function is normal.  Left Atrium: Left atrial size was moderately dilated.  Right Atrium: Right atrial size was mildly dilated.  Pericardium: There is no evidence of pericardial effusion.  Mitral Valve: Bowing of mitral valve leaflets without frank prolapse. The mitral valve is normal in structure. Mild to moderate mitral annular calcification. Trivial mitral valve regurgitation. No evidence of mitral valve stenosis.  Tricuspid Valve: The tricuspid valve is normal in structure. Tricuspid valve regurgitation is trivial. No evidence of tricuspid stenosis.  Aortic Valve: The aortic valve is tricuspid. Aortic valve regurgitation is not visualized. No aortic stenosis is present.  Pulmonic Valve: The pulmonic valve was grossly normal. Pulmonic valve regurgitation is mild. No evidence of pulmonic stenosis.  Aorta: The aortic root, ascending aorta, aortic arch and descending aorta are all structurally normal, with no evidence of dilitation or obstruction.  Venous: The inferior vena cava is normal in size with greater than 50% respiratory variability, suggesting right atrial pressure of 3 mmHg.  IAS/Shunts: The atrial septum is grossly normal.  Additional Comments: 3D was performed not requiring image post processing on an independent workstation and was indeterminate.   LEFT VENTRICLE PLAX 2D LVIDd:         5.30 cm   Diastology LVIDs:         3.67 cm   LV e' medial:    4.03 cm/s LV PW:         1.20 cm   LV E/e' medial:  18.5 LV IVS:        0.80 cm   LV e' lateral:   5.10 cm/s LVOT diam:     2.40 cm   LV E/e' lateral: 14.6 LV SV:         71 LV SV Index:   37 LVOT Area:     4.52 cm  3D  Volume EF: 3D EF:        9 % LV EDV:       143 ml LV ESV:       58 ml LV SV:        85 ml  RIGHT VENTRICLE RV Basal diam:  3.67 cm RV Mid diam:    3.80 cm RV S prime:     16.90 cm/s TAPSE (M-mode): 2.4 cm  LEFT ATRIUM             Index        RIGHT ATRIUM           Index LA diam:        4.30 cm 2.26 cm/m   RA Pressure: 3.00 mmHg LA Vol (A2C):   74.9 ml 39.32 ml/m  RA Area:     17.70 cm LA Vol (A4C):   53.7 ml 28.19 ml/m  RA Volume:   53.70 ml  28.19 ml/m LA Biplane Vol: 65.8 ml 34.54 ml/m AORTIC VALVE LVOT Vmax:   65.95 cm/s LVOT Vmean:  43.450 cm/s LVOT VTI:    0.158 m  AORTA Ao Root diam: 3.30 cm Ao Asc diam:  3.50  cm  MITRAL VALVE                TRICUSPID VALVE MV Area (PHT): cm          Estimated RAP:  3.00 mmHg MV Decel Time: 238 msec MV E velocity: 74.45 cm/s   SHUNTS MV A velocity: 118.00 cm/s  Systemic VTI:  0.16 m MV E/A ratio:  0.63         Systemic Diam: 2.40 cm  Shelda Bruckner MD Electronically signed by Shelda Bruckner MD Signature Date/Time: 08/02/2024/5:36:07 PM    Final          ______________________________________________________________________________________________     I have independently reviewed the above radiologic studies and discussed with the patient   Recent Lab Findings: Lab Results  Component Value Date   WBC 6.6 07/19/2024   HGB 16.7 07/19/2024   HCT 48.6 07/19/2024   PLT 213 07/19/2024   GLUCOSE 137 (H) 07/19/2024   CHOL 178 05/04/2024   TRIG 200.0 (H) 05/04/2024   HDL 35.00 (L) 05/04/2024   LDLDIRECT 34.0 05/03/2023   LDLCALC 103 (H) 05/04/2024   ALT 80 (H) 05/04/2024   AST 34 05/04/2024   NA 141 07/19/2024   K 3.9 07/19/2024   CL 101 07/19/2024   CREATININE 0.96 07/19/2024   BUN 17 07/19/2024   CO2 20 07/19/2024   TSH 1.330 01/07/2022   HGBA1C 5.6 05/04/2024    Intervention   Assessment / Plan:   68 y.o. male with 3V CAD.  Echocardiogram shows preserved biventricular function and no  significant valvular disease.  His case was discussed in cath conference today, and it was felt that without a suitable LAD target, surgical revascularization would not yield much benefit.  He will discuss further options with cardiology.        I  spent 40 minutes counseling the patient face to face.   Linnie MALVA Rayas 08/11/2024 3:21 PM

## 2024-08-11 ENCOUNTER — Ambulatory Visit
Attending: Thoracic Surgery (Cardiothoracic Vascular Surgery) | Admitting: Thoracic Surgery (Cardiothoracic Vascular Surgery)

## 2024-08-11 ENCOUNTER — Encounter: Payer: Self-pay | Admitting: Thoracic Surgery (Cardiothoracic Vascular Surgery)

## 2024-08-11 VITALS — BP 186/97 | HR 68 | Resp 20 | Ht 65.0 in | Wt 182.5 lb

## 2024-08-11 DIAGNOSIS — I2510 Atherosclerotic heart disease of native coronary artery without angina pectoris: Secondary | ICD-10-CM | POA: Diagnosis not present

## 2024-08-22 ENCOUNTER — Telehealth: Payer: Self-pay | Admitting: Cardiovascular Disease

## 2024-08-22 NOTE — Telephone Encounter (Signed)
 As long as he is feeling okay it is fine to wait until October.  Please have him reach out if he is having any problems.

## 2024-08-22 NOTE — Telephone Encounter (Signed)
 Patient calling in with questions concern medication that is suppose to be prescribe to him. Please advise

## 2024-08-22 NOTE — Telephone Encounter (Signed)
 Pt calling today because he received at message from Dr Wonda about what he understood is a need to start a new medication.  He reports he had an appt with Dr Shyrl who determined surgery is not needed and now he is to f/u with Dr Wonda.  Pt is asking is OK to wait until October to be seen by Dr Wonda as scheduled or if he needs to be started on the new medication now.  Advised I am unable to find any documentation in his chart regarding the need for him to start a new medication however I will forward this to Dr Wonda for review and further instructions.  Pt currently without any complaints.

## 2024-08-23 NOTE — Telephone Encounter (Signed)
 Spoke with pt and explained Dr. Wonda stated he is fine to wait until his APP appt in October unless he has any issues that arise. Pt agreed to plan and had no further questions at this time.

## 2024-09-05 ENCOUNTER — Other Ambulatory Visit (HOSPITAL_COMMUNITY)

## 2024-10-06 ENCOUNTER — Other Ambulatory Visit: Payer: Self-pay | Admitting: Cardiovascular Disease

## 2024-10-06 DIAGNOSIS — I10 Essential (primary) hypertension: Secondary | ICD-10-CM

## 2024-10-08 NOTE — Progress Notes (Signed)
 Cardiology Office Note   Date:  10/19/2024  ID:  [name], [name] 24-Dec-1956, MRN [id number] PCP: Jimmy Charlie FERNS, MD  [facility] Providers Cardiologist:  [name], MD   History of Present Illness [name] is a 68 y.o. male with past medical history of CAD, hypertension, hyperlipidemia.  Followed by Dr. Fell and presents today for follow-up post cath.  Patient previously had bare-metal stenting of the LAD with 3 stents placed in the past.  He was seen by Dr. Fell on 07/19/2024 and reported chest pressure that occurred when he would walk. He underwent cardiac catheterization on 07/28/24 that showed patent left main with no stenosis, total occlusion of the mid LAD within the old stented segment, moderate-severe mid circumflex and first OM stenoses, chronic total occlusion of RCA.  Recommended outpatient CT surgery evaluation prior to continued medical therapy in the meantime.  Echocardiogram 08/02/2024 showed EF 50-55% with regional wall motion abnormalities, grade 1 DD, normal RV systolic function, no significant valvular abnormalities.  Per note from Dr. Shyrl on 8/15, his case was discussed in cath conference and it was felt that without a suitable LAD target, surgical revascularization would not yield much benefit.  Recommended follow-up with cardiology.  Today, patient presents for follow-up post cath.  He tells me that he has not been having significant issues with chest pain.  He goes to the gym daily to do cardio exercises, often using the rowing machine, elliptical, or walk on the treadmill.  He is able to exercise without chest pain.  Notes that if he eats breakfast then exercises shortly after, he sometimes has some shortness of breath/mild chest discomfort that resolves with burping.  He does have occasional chest pain when he first stands up to start exerting himself, but this has overall been mild and has not bothered him very much.  He denies shortness of  breath.  Denies dizziness, syncope, near syncope.  Denies palpitations.  His blood pressure has been elevated at home, often in the 160s over 90s.  He is compliant with his medications.  Notes that his blood pressure is usually high when he is emotionally stressed.  He is very nervous at doctors appointments, blood pressure often elevated during appointments as well.   Studies Reviewed  Cardiac Studies & Procedures   ______________________________________________________________________________________________ CARDIAC CATHETERIZATION  CARDIAC CATHETERIZATION 07/28/2024  Conclusion   RPDA lesion is 90% stenosed.   LV end diastolic pressure is normal.  1.  Patent left main with no stenosis 2.  Total occlusion of the mid LAD within the old stented segment (bare-metal stents from 2002) 3.  Moderately severe mid circumflex and first OM stenoses of 60 to 70% 4.  Chronic total occlusion of the RCA with left-to-right collaterals 5.  Normal LVEDP  Recommendations: The patient last underwent cardiac catheterization and PCI over 20 years ago.  He has developed severe diffuse multivessel CAD with total occlusion of the LAD and right coronary artery.  He appears to have a good obtuse marginal and diagonal targets for surgery, then somewhat borderline distal LAD and PDA targets.  I am going to refer him for outpatient cardiac surgical consultation.  Will continue to treat him medically in the interim.  Discussed findings with the patient and his son.  Findings Coronary Findings Diagnostic  Dominance: Right  Left Anterior Descending Prox LAD to Mid LAD lesion is 100% stenosed. The lesion was previously treated . Dist LAD lesion is 60% stenosed.  Left Circumflex Mid Cx  lesion is 70% stenosed.  First Obtuse Marginal Branch 1st Mrg lesion is 70% stenosed.  Right Coronary Artery Dominant vessel, total occlusion with some right to right and primarily supplied by left-to-right collaterals Prox RCA  lesion is 100% stenosed.  Right Posterior Descending Artery Collaterals RPDA filled by collaterals from 1st Sept.  Collaterals RPDA filled by collaterals from 2nd Sept.  RPDA lesion is 90% stenosed.  Intervention  No interventions have been documented.     ECHOCARDIOGRAM  ECHOCARDIOGRAM COMPLETE 08/02/2024  Narrative ECHOCARDIOGRAM REPORT    Patient Name:   [name] Date of Exam: 08/02/2024 Medical Rec #:  [id number]     Height:       65.0 in Accession #:    [id number]    Weight:       183.0 lb Date of Birth:  [birth year]     BSA:          1.905 m Patient Age:    68 years      BP:           162/96 mmHg Patient Gender: M             HR:           61 bpm. Exam Location:  [address]  Procedure: 2D Echo, 3D Echo, Cardiac Doppler and Color Doppler (Both Spectral and Color Flow Doppler were utilized during procedure).  Indications:    I25.10 CAD  History:        Patient has no prior history of Echocardiogram examinations. CAD; Risk Factors:Family History of Coronary Artery Disease, Hypertension, Dyslipidemia and Former Smoker.  Sonographer:    Heather Hawks RDCS Referring Phys: CHARLIE LILLETTE DENISE  IMPRESSIONS   1. Left ventricular ejection fraction, by estimation, is 50 to 55%. The left ventricle has low normal function. The left ventricle demonstrates regional wall motion abnormalities (see scoring diagram/findings for description). There is mild left ventricular hypertrophy. Left ventricular diastolic parameters are consistent with Grade I diastolic dysfunction (impaired relaxation). There is mild hypokinesis of the left ventricular, mid-apical inferoseptal wall, apical segment and inferior wall. 2. Right ventricular systolic function is normal. The right ventricular size is normal. 3. Left atrial size was moderately dilated. 4. Right atrial size was mildly dilated. 5. The mitral valve is normal in structure. Trivial mitral valve regurgitation. No evidence of  mitral stenosis. 6. The aortic valve is tricuspid. Aortic valve regurgitation is not visualized. No aortic stenosis is present. 7. The inferior vena cava is normal in size with greater than 50% respiratory variability, suggesting right atrial pressure of 3 mmHg.  Comparison(s): No prior Echocardiogram.  Conclusion(s)/Recommendation(s): Low normal EF with focal mild hypokinesis mid to apical inferior, inferoseptal, and apical walls.  FINDINGS Left Ventricle: Left ventricular ejection fraction, by estimation, is 50 to 55%. The left ventricle has low normal function. The left ventricle demonstrates regional wall motion abnormalities. Mild hypokinesis of the left ventricular, mid-apical inferoseptal wall, apical segment and inferior wall. 3D ejection fraction reviewed and evaluated as part of the interpretation. Alternate measurement of EF is felt to be most reflective of LV function. The left ventricular internal cavity size was normal in size. There is mild left ventricular hypertrophy. Left ventricular diastolic parameters are consistent with Grade I diastolic dysfunction (impaired relaxation).  Right Ventricle: The right ventricular size is normal. No increase in right ventricular wall thickness. Right ventricular systolic function is normal.  Left Atrium: Left atrial size was moderately dilated.  Right Atrium: Right atrial size was  mildly dilated.  Pericardium: There is no evidence of pericardial effusion.  Mitral Valve: Bowing of mitral valve leaflets without frank prolapse. The mitral valve is normal in structure. Mild to moderate mitral annular calcification. Trivial mitral valve regurgitation. No evidence of mitral valve stenosis.  Tricuspid Valve: The tricuspid valve is normal in structure. Tricuspid valve regurgitation is trivial. No evidence of tricuspid stenosis.  Aortic Valve: The aortic valve is tricuspid. Aortic valve regurgitation is not visualized. No aortic stenosis is  present.  Pulmonic Valve: The pulmonic valve was grossly normal. Pulmonic valve regurgitation is mild. No evidence of pulmonic stenosis.  Aorta: The aortic root, ascending aorta, aortic arch and descending aorta are all structurally normal, with no evidence of dilitation or obstruction.  Venous: The inferior vena cava is normal in size with greater than 50% respiratory variability, suggesting right atrial pressure of 3 mmHg.  IAS/Shunts: The atrial septum is grossly normal.  Additional Comments: 3D was performed not requiring image post processing on an independent workstation and was indeterminate.   LEFT VENTRICLE PLAX 2D LVIDd:         5.30 cm   Diastology LVIDs:         3.67 cm   LV e' medial:    4.03 cm/s LV PW:         1.20 cm   LV E/e' medial:  18.5 LV IVS:        0.80 cm   LV e' lateral:   5.10 cm/s LVOT diam:     2.40 cm   LV E/e' lateral: 14.6 LV SV:         71 LV SV Index:   37 LVOT Area:     4.52 cm  3D Volume EF: 3D EF:        9 % LV EDV:       143 ml LV ESV:       58 ml LV SV:        85 ml  RIGHT VENTRICLE RV Basal diam:  3.67 cm RV Mid diam:    3.80 cm RV S prime:     16.90 cm/s TAPSE (M-mode): 2.4 cm  LEFT ATRIUM             Index        RIGHT ATRIUM           Index LA diam:        4.30 cm 2.26 cm/m   RA Pressure: 3.00 mmHg LA Vol (A2C):   74.9 ml 39.32 ml/m  RA Area:     17.70 cm LA Vol (A4C):   53.7 ml 28.19 ml/m  RA Volume:   53.70 ml  28.19 ml/m LA Biplane Vol: 65.8 ml 34.54 ml/m AORTIC VALVE LVOT Vmax:   65.95 cm/s LVOT Vmean:  43.450 cm/s LVOT VTI:    0.158 m  AORTA Ao Root diam: 3.30 cm Ao Asc diam:  3.50 cm  MITRAL VALVE                TRICUSPID VALVE MV Area (PHT): cm          Estimated RAP:  3.00 mmHg MV Decel Time: 238 msec MV E velocity: 74.45 cm/s   SHUNTS MV A velocity: 118.00 cm/s  Systemic VTI:  0.16 m MV E/A ratio:  0.63         Systemic Diam: 2.40 cm  Shelda Bruckner MD Electronically signed by Shelda Bruckner MD Signature Date/Time: 08/02/2024/5:36:07 PM  Final          ______________________________________________________________________________________________       Risk Assessment/Calculations   HYPERTENSION CONTROL Vitals:   10/19/24 0901 10/19/24 0942  BP: (!) 176/86 (!) 160/92    The patient's blood pressure is elevated above target today.  In order to address the patient's elevated BP: A new medication was prescribed today.          Physical Exam VS:  BP (!) 160/92   Pulse 66   Ht 5' 5 (1.651 m)   Wt 184 lb 12.8 oz (83.8 kg)   SpO2 97%   BMI 30.75 kg/m        Wt Readings from Last 3 Encounters:  10/19/24 184 lb 12.8 oz (83.8 kg)  08/11/24 182 lb 8 oz (82.8 kg)  07/28/24 183 lb (83 kg)    GEN: Well nourished, well developed in no acute distress. Sitting comfortably on the exam table  NECK: No JVD  CARDIAC:  RRR, no murmurs, rubs, gallops. Radial pulses 2+ bilaterally  RESPIRATORY:  Clear to auscultation without rales, wheezing or rhonchi. Normal WOB on room air   ABDOMEN: Soft, non-tender, non-distended EXTREMITIES:  No edema in BLE; No deformity   ASSESSMENT AND PLAN  CAD - Patient previously had 3 bare-metal stents put in the LAD 20+ years ago.  More recently underwent cardiac catheterization 07/28/2024 that showed patent left main with no stenosis, total occlusion of the mid LAD within the old stented segment, moderate-severe mid circumflex and first OM stenoses, chronic total occlusion of RCA.  Echocardiogram 8/6 showed EF 50-55% with regional wall motion abnormalities, grade 1 DD - Case was discussed at cath conference.  Patient not felt to be suitable candidate for revascularization as there was no good LAD target - Patient tells me he only has mild chest pain when he first starts to exert himself.  He is able to workout in the gym without significant chest pain or dyspnea on exertion.  Occasionally has chest discomfort if he eats too close  to working out, but this usually resolves with burping - Continue aspirin  81 mg daily - Continue amlodipine  10 mg daily, carvedilol  12.5 mg twice daily - Start Imdur 30 mg daily for BP control and anti-anginal benefit  - Encouraged patient to continue to exercise - Continue Crestor  5 mg daily   Resistant Hypertension - BP has been elevated at home to the 160s/90s. BP usually elevated when he is emotionally stressed. He is asymptomatic when BP elevated - Lowest BP at home was in the 140s/80s - Start imdur 30 mg daily  - Continue amlodipine  10 mg daily, carvedilol  12.5 mg BID, olmesartan -hydrochlorothiazide  40-25 mg daily  - Creatinine 0.96 and K 3.9 in 06/2024  - Ordered renal artery duplex  - Arranged follow up in 4-6 weeks with APP to review BP. If BP remains elevated, consider increasing imdur or carvedilol . May need referral to advanced HTN clinic   Hyperlipidemia -Lipid panel 04/2024 showed LDL 103, HDL 35, triglycerides 200, total cholesterol 178 - Patient was previously on repatha , stopped due to cost and side effect (diarrhea) - he is currently on Crestor  5 mg daily - unable to tolerate higher doses due to body aches  - Refer to Pharm.D. lipid clinic to consider inclisiran  Dispo: Follow up in 4-6 weeks with APP   Signed, Rollo FABIENE Louder, PA-C

## 2024-10-10 ENCOUNTER — Encounter: Payer: Self-pay | Admitting: Pharmacist

## 2024-10-10 NOTE — Progress Notes (Signed)
 Pharmacy Quality Measure Review  Statin Therapy for Patients with Cardiovascular Disease [facility])  Patient reported hx of side effects on low dose rosuvastatin . Not taking.  Statin code not billed in 2025. No f/u visit scheduled.   Next scheduled PCP visit 05/07/25. Future message sent to [place], [place].  ICD-10-CM code exceptions:  Muscular pain:  Myopathy ICD-10-CM codes include G72.0, G72.2, and G72.9.  Myositis ICD-10-CM codes include M60.80, M60.811, M60.812, and more.   Myalgia ICD-10-CM codes include M79.10, M79.11, M79.12, and more.  Rhabdomyolysis ICD-10-CM code is M62.82.    Future Appointments  Date Time Provider Department Center  10/19/2024  9:15 AM Vicci Rollo [facility], [place] [facility]  05/07/2025  9:00 AM Wendee Lynwood HERO, NP [facility] [address]

## 2024-10-19 ENCOUNTER — Ambulatory Visit: Attending: Internal Medicine | Admitting: Cardiology

## 2024-10-19 ENCOUNTER — Encounter: Payer: Self-pay | Admitting: Cardiology

## 2024-10-19 VITALS — BP 160/92 | HR 66 | Ht 65.0 in | Wt 184.8 lb

## 2024-10-19 DIAGNOSIS — M791 Myalgia, unspecified site: Secondary | ICD-10-CM | POA: Diagnosis not present

## 2024-10-19 DIAGNOSIS — I2510 Atherosclerotic heart disease of native coronary artery without angina pectoris: Secondary | ICD-10-CM | POA: Diagnosis not present

## 2024-10-19 DIAGNOSIS — E782 Mixed hyperlipidemia: Secondary | ICD-10-CM | POA: Diagnosis not present

## 2024-10-19 DIAGNOSIS — T466X5D Adverse effect of antihyperlipidemic and antiarteriosclerotic drugs, subsequent encounter: Secondary | ICD-10-CM

## 2024-10-19 DIAGNOSIS — I1A0 Resistant hypertension: Secondary | ICD-10-CM

## 2024-10-19 DIAGNOSIS — T466X5A Adverse effect of antihyperlipidemic and antiarteriosclerotic drugs, initial encounter: Secondary | ICD-10-CM

## 2024-10-19 MED ORDER — ISOSORBIDE MONONITRATE ER 30 MG PO TB24: 30.0000 mg | ORAL_TABLET | Freq: Every day | ORAL | 3 refills | Status: DC

## 2024-10-19 NOTE — Patient Instructions (Addendum)
 Medication Instructions:    START  TAKING : IMDUR 30  MG  ONCE  A DAY     *If you need a refill on your cardiac medications before your next appointment, please call your pharmacy*   Lab Work: NONE ORDERED  TODAY     If you have labs (blood work) drawn today and your tests are completely normal, you will receive your results only by: MyChart Message (if you have MyChart) OR A paper copy in the mail If you have any lab test that is abnormal or we need to change your treatment, we will call you to review the results.    Testing/Procedures: Your physician has requested that you have a renal artery duplex. During this test, an ultrasound is used to evaluate blood flow to the kidneys. Allow one hour for this exam. Do not eat after midnight the day before and avoid carbonated beverages. Take your medications as you usually do.    Follow-Up:  At [facility], you and your health needs are our priority.  As part of our continuing mission to provide you with exceptional heart care, our providers are all part of one team.  This team includes your primary Cardiologist (physician) and Advanced Practice Providers or APPs (Physician Assistants and Nurse Practitioners) who all work together to provide you with the care you need, when you need it.  Your next appointment:  YOU HAVE BEEN REFERRED TO PHARM- D FOR MEDICATION MANAGEMENT   6 WEEKS  Provider:  AVAILABLE APP      We recommend signing up for the patient portal called MyChart.  Sign up information is provided on this After Visit Summary.  MyChart is used to connect with patients for Virtual Visits (Telemedicine).  Patients are able to view lab/test results, encounter notes, upcoming appointments, etc.  Non-urgent messages can be sent to your provider as well.   To learn more about what you can do with MyChart, go to [email or web address].   Other Instructions

## 2024-10-24 ENCOUNTER — Ambulatory Visit (HOSPITAL_COMMUNITY)
Admission: RE | Admit: 2024-10-24 | Discharge: 2024-10-24 | Disposition: A | Discharge status: Home / Self Care | Source: Ambulatory Visit | Attending: Cardiology | Admitting: Cardiology

## 2024-10-24 DIAGNOSIS — I1A0 Resistant hypertension: Secondary | ICD-10-CM | POA: Diagnosis present

## 2024-10-27 ENCOUNTER — Ambulatory Visit: Payer: Self-pay | Admitting: Cardiology

## 2024-11-21 NOTE — Progress Notes (Signed)
 Cardiology Office Note   Date:  11/30/2024  ID:  Terry, Vincent [birth year], MRN [id number] PCP: Jimmy Charlie FERNS, MD  [facility] Providers Cardiologist:  [name], MD   History of Present Illness [name] is a 68 y.o. male with past medical history of CAD, hypertension, hyperlipidemia.  Followed by Dr. Fell and presents today for follow-up post cath.   Patient previously had bare-metal stenting of the LAD with 3 stents placed in the past.  He was seen by Dr. Fell on 07/19/2024 and reported chest pressure that occurred when he would walk. He underwent cardiac catheterization on 07/28/24 that showed patent left main with no stenosis, total occlusion of the mid LAD within the old stented segment, moderate-severe mid circumflex and first OM stenoses, chronic total occlusion of RCA.  Recommended outpatient CT surgery evaluation prior to continued medical therapy in the meantime.  Echocardiogram 08/02/2024 showed EF 50-55% with regional wall motion abnormalities, grade 1 DD, normal RV systolic function, no significant valvular abnormalities.   Per note from Dr. Shyrl on 8/15, his case was discussed in cath conference and it was felt that without a suitable LAD target, surgical revascularization would not yield much benefit.  Recommended follow-up with cardiology.  I saw patient in clinic on 10/23. At that time, patient was not having chest pain. Was able to exercise regularly without chest pain or DOE. His BP was elevated so he was started on imdur .   Today, patient presents for follow-up appointment.  Tells me that since last being seen, he has continued to have occasional episodes of chest pain.  If he tries to walk or do something too quickly, he gets a mild chest tightness that usually goes away with rest.  He did have one of these episodes when walking into the clinic today.  If he walks at a slower, more steady pace, he does not have chest pain.  He is able to do some  light cardio at the gym (elliptical, row), and as long as he does not push himself too hard he does not have chest pain.  Denies shortness of breath.  Denies headache, vision changes, dizziness, syncope or near syncope.  His blood pressure continues to be elevated.  Estimates that his blood pressure is in the 160s over 80s at home.  Today in the 170s systolic. He tells me that his blood pressures always been elevated and has never been very well-controlled.  He does not have symptoms when BP is elevated    Studies Reviewed Cardiac Studies & Procedures   ______________________________________________________________________________________________ CARDIAC CATHETERIZATION  CARDIAC CATHETERIZATION 07/28/2024  Conclusion   RPDA lesion is 90% stenosed.   LV end diastolic pressure is normal.  1.  Patent left main with no stenosis 2.  Total occlusion of the mid LAD within the old stented segment (bare-metal stents from 2002) 3.  Moderately severe mid circumflex and first OM stenoses of 60 to 70% 4.  Chronic total occlusion of the RCA with left-to-right collaterals 5.  Normal LVEDP  Recommendations: The patient last underwent cardiac catheterization and PCI over 20 years ago.  He has developed severe diffuse multivessel CAD with total occlusion of the LAD and right coronary artery.  He appears to have a good obtuse marginal and diagonal targets for surgery, then somewhat borderline distal LAD and PDA targets.  I am going to refer him for outpatient cardiac surgical consultation.  Will continue to treat him medically in the interim.  Discussed findings  with the patient and his son.  Findings Coronary Findings Diagnostic  Dominance: Right  Left Anterior Descending Prox LAD to Mid LAD lesion is 100% stenosed. The lesion was previously treated . Dist LAD lesion is 60% stenosed.  Left Circumflex Mid Cx lesion is 70% stenosed.  First Obtuse Marginal Branch 1st Mrg lesion is 70% stenosed.  Right  Coronary Artery Dominant vessel, total occlusion with some right to right and primarily supplied by left-to-right collaterals Prox RCA lesion is 100% stenosed.  Right Posterior Descending Artery Collaterals RPDA filled by collaterals from 1st Sept.  Collaterals RPDA filled by collaterals from 2nd Sept.  RPDA lesion is 90% stenosed.  Intervention  No interventions have been documented.     ECHOCARDIOGRAM  ECHOCARDIOGRAM COMPLETE 08/02/2024  Narrative ECHOCARDIOGRAM REPORT    Patient Name:   [name] Date of Exam: 08/02/2024 Medical Rec #:  [id number]     Height:       65.0 in Accession #:    [id number]    Weight:       183.0 lb Date of Birth:  1956/07/05     BSA:          1.905 m Patient Age:    68 years      BP:           162/96 mmHg Patient Gender: M             HR:           61 bpm. Exam Location:  [address]  Procedure: 2D Echo, 3D Echo, Cardiac Doppler and Color Doppler (Both Spectral and Color Flow Doppler were utilized during procedure).  Indications:    I25.10 CAD  History:        Patient has no prior history of Echocardiogram examinations. CAD; Risk Factors:Family History of Coronary Artery Disease, Hypertension, Dyslipidemia and Former Smoker.  Sonographer:    Heather Hawks RDCS Referring Phys: CHARLIE LILLETTE DENISE  IMPRESSIONS   1. Left ventricular ejection fraction, by estimation, is 50 to 55%. The left ventricle has low normal function. The left ventricle demonstrates regional wall motion abnormalities (see scoring diagram/findings for description). There is mild left ventricular hypertrophy. Left ventricular diastolic parameters are consistent with Grade I diastolic dysfunction (impaired relaxation). There is mild hypokinesis of the left ventricular, mid-apical inferoseptal wall, apical segment and inferior wall. 2. Right ventricular systolic function is normal. The right ventricular size is normal. 3. Left atrial size was moderately dilated. 4.  Right atrial size was mildly dilated. 5. The mitral valve is normal in structure. Trivial mitral valve regurgitation. No evidence of mitral stenosis. 6. The aortic valve is tricuspid. Aortic valve regurgitation is not visualized. No aortic stenosis is present. 7. The inferior vena cava is normal in size with greater than 50% respiratory variability, suggesting right atrial pressure of 3 mmHg.  Comparison(s): No prior Echocardiogram.  Conclusion(s)/Recommendation(s): Low normal EF with focal mild hypokinesis mid to apical inferior, inferoseptal, and apical walls.  FINDINGS Left Ventricle: Left ventricular ejection fraction, by estimation, is 50 to 55%. The left ventricle has low normal function. The left ventricle demonstrates regional wall motion abnormalities. Mild hypokinesis of the left ventricular, mid-apical inferoseptal wall, apical segment and inferior wall. 3D ejection fraction reviewed and evaluated as part of the interpretation. Alternate measurement of EF is felt to be most reflective of LV function. The left ventricular internal cavity size was normal in size. There is mild left ventricular hypertrophy. Left ventricular diastolic parameters are consistent with  Grade I diastolic dysfunction (impaired relaxation).  Right Ventricle: The right ventricular size is normal. No increase in right ventricular wall thickness. Right ventricular systolic function is normal.  Left Atrium: Left atrial size was moderately dilated.  Right Atrium: Right atrial size was mildly dilated.  Pericardium: There is no evidence of pericardial effusion.  Mitral Valve: Bowing of mitral valve leaflets without frank prolapse. The mitral valve is normal in structure. Mild to moderate mitral annular calcification. Trivial mitral valve regurgitation. No evidence of mitral valve stenosis.  Tricuspid Valve: The tricuspid valve is normal in structure. Tricuspid valve regurgitation is trivial. No evidence of tricuspid  stenosis.  Aortic Valve: The aortic valve is tricuspid. Aortic valve regurgitation is not visualized. No aortic stenosis is present.  Pulmonic Valve: The pulmonic valve was grossly normal. Pulmonic valve regurgitation is mild. No evidence of pulmonic stenosis.  Aorta: The aortic root, ascending aorta, aortic arch and descending aorta are all structurally normal, with no evidence of dilitation or obstruction.  Venous: The inferior vena cava is normal in size with greater than 50% respiratory variability, suggesting right atrial pressure of 3 mmHg.  IAS/Shunts: The atrial septum is grossly normal.  Additional Comments: 3D was performed not requiring image post processing on an independent workstation and was indeterminate.   LEFT VENTRICLE PLAX 2D LVIDd:         5.30 cm   Diastology LVIDs:         3.67 cm   LV e' medial:    4.03 cm/s LV PW:         1.20 cm   LV E/e' medial:  18.5 LV IVS:        0.80 cm   LV e' lateral:   5.10 cm/s LVOT diam:     2.40 cm   LV E/e' lateral: 14.6 LV SV:         71 LV SV Index:   37 LVOT Area:     4.52 cm  3D Volume EF: 3D EF:        9 % LV EDV:       143 ml LV ESV:       58 ml LV SV:        85 ml  RIGHT VENTRICLE RV Basal diam:  3.67 cm RV Mid diam:    3.80 cm RV S prime:     16.90 cm/s TAPSE (M-mode): 2.4 cm  LEFT ATRIUM             Index        RIGHT ATRIUM           Index LA diam:        4.30 cm 2.26 cm/m   RA Pressure: 3.00 mmHg LA Vol (A2C):   74.9 ml 39.32 ml/m  RA Area:     17.70 cm LA Vol (A4C):   53.7 ml 28.19 ml/m  RA Volume:   53.70 ml  28.19 ml/m LA Biplane Vol: 65.8 ml 34.54 ml/m AORTIC VALVE LVOT Vmax:   65.95 cm/s LVOT Vmean:  43.450 cm/s LVOT VTI:    0.158 m  AORTA Ao Root diam: 3.30 cm Ao Asc diam:  3.50 cm  MITRAL VALVE                TRICUSPID VALVE MV Area (PHT): cm          Estimated RAP:  3.00 mmHg MV Decel Time: 238 msec MV E velocity: 74.45 cm/s   SHUNTS MV A  velocity: 118.00 cm/s  Systemic VTI:  0.16  m MV E/A ratio:  0.63         Systemic Diam: 2.40 cm  Shelda Bruckner MD Electronically signed by Shelda Bruckner MD Signature Date/Time: 08/02/2024/5:36:07 PM    Final          ______________________________________________________________________________________________      Risk Assessment/Calculations   HYPERTENSION CONTROL Vitals:   11/30/24 1440 11/30/24 1442 11/30/24 1503  BP: (!) 170/100 (!) 173/92 (!) 178/100    The patient's blood pressure is elevated above target today.  In order to address the patient's elevated BP: A current anti-hypertensive medication was adjusted today.; A referral to the [facility]pert[facility] will be placed.          Physical Exam VS:  BP (!) 178/100   Pulse 78   Ht 5' 5 (1.651 m)   Wt 187 lb (84.8 kg)   SpO2 95%   BMI 31.12 kg/m        Wt Readings from Last 3 Encounters:  11/30/24 187 lb (84.8 kg)  10/19/24 184 lb 12.8 oz (83.8 kg)  08/11/24 182 lb 8 oz (82.8 kg)    GEN: Well nourished, well developed in no acute distress.  Sitting comfortably in the chair NECK: No JVD  CARDIAC:  RRR, no murmurs, rubs, gallops.  Radial pulses 2+ bilaterally RESPIRATORY:  Clear to auscultation without rales, wheezing or rhonchi.  Normal work of breathing on room air ABDOMEN: Soft, non-tender, non-distended EXTREMITIES:  No edema in bilateral lower extremities; No deformity   ASSESSMENT AND PLAN  CAD - Patient previously had 3 bare-metal stents put in the LAD 20+ years ago.  More recently underwent cardiac catheterization 07/28/2024 that showed patent left main with no stenosis, total occlusion of the mid LAD within the old stented segment, moderate-severe mid circumflex and first OM stenoses, chronic total occlusion of RCA.  Echocardiogram 8/6 showed EF 50-55% with regional wall motion abnormalities, grade 1 DD - Case was discussed at cath conference.  Patient not felt to be suitable candidate for revascularization as there  was no good LAD target - Seen in clinic 10/23. Started on imdur  30 mg daily  - Patient tells me that he continues to have chest pain if he moves too quickly. Usually resolves with rest. If he moves at a slower, more steady pace he does not get chest pain   - Increase imdur  to 60 mg daily for anti-anginal benefit and better BP control  - Continue aspirin  81 mg daily - Continue amlodipine  10 mg daily, carvedilol  12.5 mg twice daily  - Continue Crestor  5 mg daily    Resistant Hypertension - Renal artery ultrasound 10/31 showed no evidence of stenosis bilaterally  - BP continues to be elevated, often in the 160s over 80s at home. He does not eat much salt in his diet. Exercises regularly. Denies recent stressors.  - Increase Imdur  to 60 mg daily - Continue amlodipine  10 mg daily, carvedilol  12.5 mg BID, olmesartan -hydrochlorothiazide  40-25 mg daily  - Refer to advanced hypertension clinic - Creatinine 0.96 and K 3.9 in 06/2024    Hyperlipidemia -Lipid panel 04/2024 showed LDL 103, HDL 35, triglycerides 200, total cholesterol 178 - Patient was previously on repatha , stopped due to cost and side effect (diarrhea) - he is currently on Crestor  5 mg daily - unable to tolerate higher doses due to body aches  - At last visit patient was referred to pharmD lipid clinic but did not answer/call to  schedule an appointment   Dispo: Follow up with Dr. Wonda in 4 months   Signed, Rollo FABIENE Louder, PA-C

## 2024-11-30 ENCOUNTER — Ambulatory Visit: Attending: Cardiology | Admitting: Cardiology

## 2024-11-30 ENCOUNTER — Encounter: Payer: Self-pay | Admitting: Cardiology

## 2024-11-30 VITALS — BP 178/100 | HR 78 | Ht 65.0 in | Wt 187.0 lb

## 2024-11-30 DIAGNOSIS — E782 Mixed hyperlipidemia: Secondary | ICD-10-CM

## 2024-11-30 DIAGNOSIS — I1A0 Resistant hypertension: Secondary | ICD-10-CM | POA: Diagnosis not present

## 2024-11-30 DIAGNOSIS — I25118 Atherosclerotic heart disease of native coronary artery with other forms of angina pectoris: Secondary | ICD-10-CM

## 2024-11-30 MED ORDER — ISOSORBIDE MONONITRATE ER 60 MG PO TB24: 60.0000 mg | ORAL_TABLET | Freq: Every day | ORAL | 3 refills | Status: AC

## 2024-11-30 NOTE — Patient Instructions (Signed)
 Medication Instructions:  Increase Imdur  60 mg daily  *If you need a refill on your cardiac medications before your next appointment, please call your pharmacy*  Lab Work: NONE ordered at this time of appointment   Testing/Procedures: NONE ordered at this time of appointment   Follow-Up: At [facility], you and your health needs are our priority.  As part of our continuing mission to provide you with exceptional heart care, our providers are all part of one team.  This team includes your primary Cardiologist (physician) and Advanced Practice Providers or APPs (Physician Assistants and Nurse Practitioners) who all work together to provide you with the care you need, when you need it.  Your next appointment:   4 month Dr. Wonda Advanced Hypertension- Dr. Raford   We recommend signing up for the patient portal called MyChart.  Sign up information is provided on this After Visit Summary.  MyChart is used to connect with patients for Virtual Visits (Telemedicine).  Patients are able to view lab/test results, encounter notes, upcoming appointments, etc.  Non-urgent messages can be sent to your provider as well.   To learn more about what you can do with MyChart, go to [email or web address].

## 2024-12-14 ENCOUNTER — Telehealth: Payer: Self-pay | Admitting: Cardiovascular Disease

## 2024-12-14 NOTE — Telephone Encounter (Signed)
 Calling to verify fax regarding medication request. Please advise

## 2024-12-14 NOTE — Telephone Encounter (Signed)
 Spoke with [employer] Rep. They were asking for confirmation that the fax for CMS suggestion for Statin Therapy for Patients with Cardiovascular Disease was received in order to close out the encounter. Informed him I would forward along to the nurse in clinic covering provider.

## 2025-01-01 ENCOUNTER — Other Ambulatory Visit: Payer: Self-pay | Admitting: Physician Assistant

## 2025-05-07 ENCOUNTER — Encounter: Admitting: Nurse Practitioner
